# Patient Record
Sex: Female | Born: 1965 | Race: White | Hispanic: No | Smoking: Never smoker
Health system: Southern US, Community
[De-identification: ages and names within clinical notes are randomized; demographics above are authoritative.]

## PROBLEM LIST (undated history)

## (undated) DIAGNOSIS — I10 Essential (primary) hypertension: Secondary | ICD-10-CM

## (undated) DIAGNOSIS — N159 Renal tubulo-interstitial disease, unspecified: Secondary | ICD-10-CM

## (undated) DIAGNOSIS — S62109A Fracture of unspecified carpal bone, unspecified wrist, initial encounter for closed fracture: Secondary | ICD-10-CM

## (undated) HISTORY — PX: ABLATION: SHX5711

## (undated) HISTORY — PX: GALLBLADDER SURGERY: SHX652

## (undated) HISTORY — PX: TUBAL LIGATION: SHX77

## (undated) HISTORY — DX: Essential (primary) hypertension: I10

## (undated) HISTORY — DX: Renal tubulo-interstitial disease, unspecified: N15.9

## (undated) HISTORY — DX: Fracture of unspecified carpal bone, unspecified wrist, initial encounter for closed fracture: S62.109A

---

## 2006-05-19 ENCOUNTER — Ambulatory Visit (HOSPITAL_COMMUNITY): Admission: RE | Admit: 2006-05-19 | Discharge: 2006-05-19 | Payer: Self-pay | Admitting: Obstetrics & Gynecology

## 2006-05-19 ENCOUNTER — Encounter (INDEPENDENT_AMBULATORY_CARE_PROVIDER_SITE_OTHER): Payer: Self-pay | Admitting: *Deleted

## 2008-02-17 ENCOUNTER — Ambulatory Visit (HOSPITAL_COMMUNITY): Admission: RE | Admit: 2008-02-17 | Discharge: 2008-02-17 | Payer: Self-pay | Admitting: *Deleted

## 2008-07-13 ENCOUNTER — Other Ambulatory Visit: Admission: RE | Admit: 2008-07-13 | Discharge: 2008-07-13 | Payer: Self-pay | Admitting: Obstetrics and Gynecology

## 2010-12-11 ENCOUNTER — Emergency Department (HOSPITAL_COMMUNITY)
Admission: EM | Admit: 2010-12-11 | Discharge: 2010-12-11 | Payer: Self-pay | Source: Home / Self Care | Admitting: Emergency Medicine

## 2011-01-11 ENCOUNTER — Encounter: Payer: Self-pay | Admitting: *Deleted

## 2011-02-24 ENCOUNTER — Other Ambulatory Visit: Payer: Self-pay | Admitting: Obstetrics & Gynecology

## 2011-02-24 DIAGNOSIS — N644 Mastodynia: Secondary | ICD-10-CM

## 2011-02-24 DIAGNOSIS — N63 Unspecified lump in unspecified breast: Secondary | ICD-10-CM

## 2011-03-04 ENCOUNTER — Ambulatory Visit (HOSPITAL_COMMUNITY)
Admission: RE | Admit: 2011-03-04 | Discharge: 2011-03-04 | Disposition: A | Discharge status: Home / Self Care | Payer: BC Managed Care – PPO | Source: Ambulatory Visit | Attending: Obstetrics & Gynecology | Admitting: Obstetrics & Gynecology

## 2011-03-04 DIAGNOSIS — N63 Unspecified lump in unspecified breast: Secondary | ICD-10-CM | POA: Insufficient documentation

## 2011-03-04 DIAGNOSIS — N6009 Solitary cyst of unspecified breast: Secondary | ICD-10-CM | POA: Insufficient documentation

## 2011-03-04 DIAGNOSIS — N644 Mastodynia: Secondary | ICD-10-CM

## 2011-03-23 ENCOUNTER — Other Ambulatory Visit (HOSPITAL_COMMUNITY): Payer: Self-pay | Admitting: Family Medicine

## 2011-03-23 DIAGNOSIS — N6489 Other specified disorders of breast: Secondary | ICD-10-CM

## 2011-04-01 ENCOUNTER — Ambulatory Visit (HOSPITAL_COMMUNITY)
Admission: RE | Admit: 2011-04-01 | Discharge: 2011-04-01 | Disposition: A | Discharge status: Home / Self Care | Payer: BC Managed Care – PPO | Source: Ambulatory Visit | Attending: Family Medicine | Admitting: Family Medicine

## 2011-04-01 ENCOUNTER — Ambulatory Visit (HOSPITAL_COMMUNITY): Payer: BC Managed Care – PPO

## 2011-04-01 DIAGNOSIS — N6489 Other specified disorders of breast: Secondary | ICD-10-CM

## 2011-04-03 ENCOUNTER — Other Ambulatory Visit (HOSPITAL_COMMUNITY)
Admission: RE | Admit: 2011-04-03 | Discharge: 2011-04-03 | Disposition: A | Discharge status: Home / Self Care | Payer: BC Managed Care – PPO | Source: Ambulatory Visit | Attending: Obstetrics & Gynecology | Admitting: Obstetrics & Gynecology

## 2011-04-03 ENCOUNTER — Other Ambulatory Visit: Payer: Self-pay | Admitting: Obstetrics & Gynecology

## 2011-04-03 DIAGNOSIS — Z01419 Encounter for gynecological examination (general) (routine) without abnormal findings: Secondary | ICD-10-CM | POA: Insufficient documentation

## 2011-05-08 NOTE — Op Note (Signed)
Brandy Washington, Brandy Washington                 ACCOUNT NO.:  [id number]   MEDICAL RECORD NO.:  [id number]          PATIENT TYPE:  AMB   LOCATION:  DAY                           FACILITY:  [facility]   PHYSICIAN:  Lazaro Arms, M.D.   DATE OF BIRTH:  1966-08-17   DATE OF PROCEDURE:  05/19/2006  DATE OF DISCHARGE:                                 OPERATIVE REPORT   PREOPERATIVE DIAGNOSES:  1.  Menometrorrhagia.  2.  Dysmenorrhea.   POSTOPERATIVE DIAGNOSES:  1.  Menometrorrhagia.  2.  Dysmenorrhea.   OPERATION PERFORMED:  Hysteroscopy, dilation and curettage and endometrial  ablation.   SURGEON:  Lazaro Arms, M.D.   ANESTHESIA:  General endotracheal.   FINDINGS:  The patient had a normal endometrial cavity.  There was an  appropriate amount of endometrial tissue, no polyps, no fibroids, no  abnormalities.   DESCRIPTION OF PROCEDURE:  The patient was taken to the operating room,  placed in supine position where she underwent general endotracheal  anesthesia, placed in dorsal lithotomy position, prepped and draped in the  usual sterile fashion.  A Graves speculum was placed.  The cervix was  grasped, the cervix was dilated serially to allow passage of a hysteroscope.  Hysteroscopy was performed and the above noted findings were seen.  Vigorous  uterine curettage was performed and good uterine cry was obtained in all  areas.  Endometrial ablation was then performed using the ThermaChoice 3  balloon that required 16 mL of D5W to maintain a pressure between 190 and  200 mmHg throughout the procedure.  Total therapy time was five minutes and  11 seconds.  The temperature of ablation was 80 degrees Celsius.  The  patient was awakened from anesthesia after all the fluid was recovered from  the balloon, taken to recovery room in good stable condition, all counts  correct.      Lazaro Arms, M.D.  Electronically Signed     LHE/MEDQ  D:  05/19/2006  T:  05/19/2006  Job:  [id number]

## 2011-09-11 LAB — CSF CULTURE W GRAM STAIN
Culture: NO GROWTH
Gram Stain: NONE SEEN

## 2011-09-11 LAB — CSF CELL COUNT WITH DIFFERENTIAL
Eosinophils, CSF: 0
Eosinophils, CSF: 0
Monocyte-Macrophage-Spinal Fluid: 0 — ABNORMAL LOW
RBC Count, CSF: 19 — ABNORMAL HIGH
RBC Count, CSF: 2 — ABNORMAL HIGH
Segmented Neutrophils-CSF: 0
Tube #: 1
Tube #: 4
WBC, CSF: 2
WBC, CSF: 2

## 2011-09-11 LAB — B. BURGDORFI ANTIBODIES, CSF: Lyme Ab: 0.08 LIV

## 2011-09-11 LAB — HSV PCR
HSV 2 , PCR: NOT DETECTED
HSV, PCR: NOT DETECTED

## 2011-09-11 LAB — CYTOMEGALOVIRUS PCR, QUALITATIVE: Cytomegalovirus DNA: NOT DETECTED

## 2011-09-11 LAB — PROTEIN AND GLUCOSE, CSF
Glucose, CSF: 51
Glucose, CSF: 54
Total  Protein, CSF: 32
Total  Protein, CSF: 36

## 2011-09-11 LAB — OLIGOCLONAL BANDS, CSF + SERM
Albumin Index: 3.6 ratio (ref 0.0–9.0)
Albumin, CSF: 19 mg/dL (ref 0–35)
Albumin, Serum(Neph): 5240 mg/dL — ABNORMAL HIGH (ref 3500–5200)
CSF Oligoclonal Bands: NEGATIVE
IgG Index, CSF: 0.6 ratio (ref 0.28–0.66)
IgG, CSF: 2.7 mg/dL (ref 0.0–6.0)
IgG, Serum: 1240 mg/dL (ref 768–1632)
IgG/Albumin Ratio, CSF: 0.14 ratio (ref 0.09–0.25)
MS CNS IgG Synthesis Rate: 0 mg/d (ref 0.0–8.0)

## 2011-09-11 LAB — VDRL, CSF: VDRL Quant, CSF: NONREACTIVE

## 2012-03-10 ENCOUNTER — Other Ambulatory Visit: Payer: Self-pay | Admitting: Obstetrics & Gynecology

## 2012-03-10 DIAGNOSIS — Z139 Encounter for screening, unspecified: Secondary | ICD-10-CM

## 2012-03-17 ENCOUNTER — Ambulatory Visit (HOSPITAL_COMMUNITY)
Admission: RE | Admit: 2012-03-17 | Discharge: 2012-03-17 | Disposition: A | Discharge status: Home / Self Care | Payer: BC Managed Care – PPO | Source: Ambulatory Visit | Attending: Obstetrics & Gynecology | Admitting: Obstetrics & Gynecology

## 2012-03-17 DIAGNOSIS — Z139 Encounter for screening, unspecified: Secondary | ICD-10-CM

## 2012-03-17 DIAGNOSIS — Z1231 Encounter for screening mammogram for malignant neoplasm of breast: Secondary | ICD-10-CM | POA: Insufficient documentation

## 2012-04-07 ENCOUNTER — Other Ambulatory Visit (HOSPITAL_COMMUNITY)
Admission: RE | Admit: 2012-04-07 | Discharge: 2012-04-07 | Disposition: A | Discharge status: Home / Self Care | Payer: BC Managed Care – PPO | Source: Ambulatory Visit | Attending: Obstetrics & Gynecology | Admitting: Obstetrics & Gynecology

## 2012-04-07 ENCOUNTER — Other Ambulatory Visit: Payer: Self-pay | Admitting: Obstetrics & Gynecology

## 2012-04-07 DIAGNOSIS — Z01419 Encounter for gynecological examination (general) (routine) without abnormal findings: Secondary | ICD-10-CM | POA: Insufficient documentation
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
## 2013-05-05 ENCOUNTER — Other Ambulatory Visit: Payer: Self-pay | Admitting: Obstetrics & Gynecology

## 2013-05-05 DIAGNOSIS — Z139 Encounter for screening, unspecified: Secondary | ICD-10-CM

## 2013-05-08 ENCOUNTER — Encounter: Payer: Self-pay | Admitting: *Deleted

## 2013-05-09 ENCOUNTER — Encounter: Payer: Self-pay | Admitting: Obstetrics & Gynecology

## 2013-05-09 ENCOUNTER — Ambulatory Visit (HOSPITAL_COMMUNITY)
Admission: RE | Admit: 2013-05-09 | Discharge: 2013-05-09 | Disposition: A | Discharge status: Home / Self Care | Payer: BC Managed Care – PPO | Source: Ambulatory Visit | Attending: Obstetrics & Gynecology | Admitting: Obstetrics & Gynecology

## 2013-05-09 ENCOUNTER — Ambulatory Visit (INDEPENDENT_AMBULATORY_CARE_PROVIDER_SITE_OTHER): Payer: BC Managed Care – PPO | Admitting: Obstetrics & Gynecology

## 2013-05-09 VITALS — BP 150/80 | Ht 65.0 in | Wt 257.0 lb

## 2013-05-09 DIAGNOSIS — Z139 Encounter for screening, unspecified: Secondary | ICD-10-CM

## 2013-05-09 DIAGNOSIS — Z01419 Encounter for gynecological examination (general) (routine) without abnormal findings: Secondary | ICD-10-CM

## 2013-05-09 DIAGNOSIS — Z1231 Encounter for screening mammogram for malignant neoplasm of breast: Secondary | ICD-10-CM | POA: Insufficient documentation

## 2013-05-09 NOTE — Progress Notes (Signed)
Patient ID: Brandy Washington, female   DOB: 1966/04/01, 47 y.o.   MRN: [id number] Subjective:     Brandy Washington is a 47 y.o. female here for a routine exam.  No LMP recorded. Patient is postmenopausal. G3P3 Current complaints: none.  Personal health questionnaire reviewed: yes.   Gynecologic History No LMP recorded. Patient is postmenopausal. Contraception: post menopausal status Last Pap: 2013. Results were: normal Last mammogram: today. Results were: pending  Obstetric History OB History   Grav Para Term Preterm Abortions TAB SAB Ect Mult Living   3 3        2      # Outc Date GA Lbr Len/2nd Wgt Sex Del Anes PTL Lv   1 PAR 1988   8lb3oz(3.714kg) F SVD      2 PAR 1989    F SVD   Yes   3 PAR 1990   8lb9oz(3.884kg) M SVD   Yes       The following portions of the patient's history were reviewed and updated as appropriate: allergies, current medications, past family history, past medical history, past social history, past surgical history and problem list.  Review of Systems  Review of Systems  Constitutional: Negative for fever, chills, weight loss, malaise/fatigue and diaphoresis.  HENT: Negative for hearing loss, ear pain, nosebleeds, congestion, sore throat, neck pain, tinnitus and ear discharge.   Eyes: Negative for blurred vision, double vision, photophobia, pain, discharge and redness.  Respiratory: Negative for cough, hemoptysis, sputum production, shortness of breath, wheezing and stridor.   Cardiovascular: Negative for chest pain, palpitations, orthopnea, claudication, leg swelling and PND.  Gastrointestinal: negative for abdominal pain. Negative for heartburn, nausea, vomiting, diarrhea, constipation, blood in stool and melena.  Genitourinary: Negative for dysuria, urgency, frequency, hematuria and flank pain.  Musculoskeletal: Negative for myalgias, back pain, joint pain and falls.  Skin: Negative for itching and rash.  Neurological: Negative for dizziness, tingling,  tremors, sensory change, speech change, focal weakness, seizures, loss of consciousness, weakness and headaches.  Endo/Heme/Allergies: Negative for environmental allergies and polydipsia. Does not bruise/bleed easily.  Psychiatric/Behavioral: Negative for depression, suicidal ideas, hallucinations, memory loss and substance abuse. The patient is not nervous/anxious and does not have insomnia.        Objective:    Physical Exam  Vitals reviewed. Constitutional: She is oriented to person, place, and time. She appears well-developed and well-nourished.  HENT:  Head: Normocephalic and atraumatic.        Right Ear: External ear normal.  Left Ear: External ear normal.  Nose: Nose normal.  Mouth/Throat: Oropharynx is clear and moist.  Eyes: Conjunctivae and EOM are normal. Pupils are equal, round, and reactive to light. Right eye exhibits no discharge. Left eye exhibits no discharge. No scleral icterus.  Neck: Normal range of motion. Neck supple. No tracheal deviation present. No thyromegaly present.  Cardiovascular: Normal rate, regular rhythm, normal heart sounds and intact distal pulses.  Exam reveals no gallop and no friction rub.   No murmur heard. Respiratory: Effort normal and breath sounds normal. No respiratory distress. She has no wheezes. She has no rales. She exhibits no tenderness.  GI: Soft. Bowel sounds are normal. She exhibits no distension and no mass. There is no tenderness. There is no rebound and no guarding.  Genitourinary:       Vulva is normal without lesions Vagina is pink moist without discharge Cervix normal in appearance and pap is done Uterus is normal size shape and contour Adnexa is negative  with normal sized ovaries   Musculoskeletal: Normal range of motion. She exhibits no edema and no tenderness.  Neurological: She is alert and oriented to person, place, and time. She has normal reflexes. She displays normal reflexes. No cranial nerve deficit. She exhibits  normal muscle tone. Coordination normal.  Skin: Skin is warm and dry. No rash noted. No erythema. No pallor.  Psychiatric: She has a normal mood and affect. Her behavior is normal. Judgment and thought content normal.       Assessment:    Healthy female exam.    Plan:    Follow up in: 1 year.

## 2014-05-10 ENCOUNTER — Other Ambulatory Visit (HOSPITAL_COMMUNITY)
Admission: RE | Admit: 2014-05-10 | Discharge: 2014-05-10 | Disposition: A | Discharge status: Home / Self Care | Payer: BC Managed Care – PPO | Source: Ambulatory Visit | Attending: Obstetrics & Gynecology | Admitting: Obstetrics & Gynecology

## 2014-05-10 ENCOUNTER — Ambulatory Visit (INDEPENDENT_AMBULATORY_CARE_PROVIDER_SITE_OTHER): Payer: BC Managed Care – PPO | Admitting: Obstetrics & Gynecology

## 2014-05-10 ENCOUNTER — Encounter: Payer: Self-pay | Admitting: Obstetrics & Gynecology

## 2014-05-10 VITALS — BP 100/80 | Ht 66.0 in | Wt 257.0 lb

## 2014-05-10 DIAGNOSIS — Z01419 Encounter for gynecological examination (general) (routine) without abnormal findings: Secondary | ICD-10-CM

## 2014-05-10 DIAGNOSIS — I10 Essential (primary) hypertension: Secondary | ICD-10-CM | POA: Insufficient documentation

## 2014-05-10 DIAGNOSIS — Z1151 Encounter for screening for human papillomavirus (HPV): Secondary | ICD-10-CM | POA: Insufficient documentation

## 2014-05-10 NOTE — Progress Notes (Signed)
Patient ID: Brandy Washington, female   DOB: May 16, 1966, 48 y.o.   MRN: [id number] Subjective:     Brandy Washington is a 48 y.o. female here for a routine exam.  No LMP recorded. Patient has had an ablation. G3P3 Birth Control Method:  *BTL** Menstrual Calendar(currently): amenorrheic  Current complaints: none.   Current acute medical issues:  hypertension   Recent Gynecologic History No LMP recorded. Patient has had an ablation. Last Pap: 2014,  normal Last mammogram: 2014,  normal  Past Medical History  Diagnosis Date  . Broken wrist     right  . Kidney infection   . Hypertension     Past Surgical History  Procedure Laterality Date  . Tubal ligation      OB History   Grav Para Term Preterm Abortions TAB SAB Ect Mult Living   3 3        2       History   Social History  . Marital Status: Married    Spouse Name: N/A    Number of Children: N/A  . Years of Education: N/A   Social History Main Topics  . Smoking status: Never Smoker   . Smokeless tobacco: Never Used  . Alcohol Use: No  . Drug Use: No  . Sexual Activity: Yes   Other Topics Concern  . None   Social History Narrative  . None    Family History  Problem Relation Age of Onset  . Stroke Other   . Hypertension Other   . Heart disease Other   . Cancer Other      Review of Systems  Review of Systems  Constitutional: Negative for fever, chills, weight loss, malaise/fatigue and diaphoresis.  HENT: Negative for hearing loss, ear pain, nosebleeds, congestion, sore throat, neck pain, tinnitus and ear discharge.   Eyes: Negative for blurred vision, double vision, photophobia, pain, discharge and redness.  Respiratory: Negative for cough, hemoptysis, sputum production, shortness of breath, wheezing and stridor.   Cardiovascular: Negative for chest pain, palpitations, orthopnea, claudication, leg swelling and PND.  Gastrointestinal: negative for abdominal pain. Negative for heartburn, nausea, vomiting,  diarrhea, constipation, blood in stool and melena.  Genitourinary: Negative for dysuria, urgency, frequency, hematuria and flank pain.  Musculoskeletal: Negative for myalgias, back pain, joint pain and falls.  Skin: Negative for itching and rash.  Neurological: Negative for dizziness, tingling, tremors, sensory change, speech change, focal weakness, seizures, loss of consciousness, weakness and headaches.  Endo/Heme/Allergies: Negative for environmental allergies and polydipsia. Does not bruise/bleed easily.  Psychiatric/Behavioral: Negative for depression, suicidal ideas, hallucinations, memory loss and substance abuse. The patient is not nervous/anxious and does not have insomnia.        Objective:    Physical Exam  Vitals reviewed. Constitutional: She is oriented to person, place, and time. She appears well-developed and well-nourished.  HENT:  Head: Normocephalic and atraumatic.        Right Ear: External ear normal.  Left Ear: External ear normal.  Nose: Nose normal.  Mouth/Throat: Oropharynx is clear and moist.  Eyes: Conjunctivae and EOM are normal. Pupils are equal, round, and reactive to light. Right eye exhibits no discharge. Left eye exhibits no discharge. No scleral icterus.  Neck: Normal range of motion. Neck supple. No tracheal deviation present. No thyromegaly present.  Cardiovascular: Normal rate, regular rhythm, normal heart sounds and intact distal pulses.  Exam reveals no gallop and no friction rub.   No murmur heard. Respiratory: Effort normal and breath sounds  normal. No respiratory distress. She has no wheezes. She has no rales. She exhibits no tenderness.  GI: Soft. Bowel sounds are normal. She exhibits no distension and no mass. There is no tenderness. There is no rebound and no guarding.  Genitourinary:  Breasts no masses skin changes or nipple changes bilaterally      Vulva is normal without lesions Vagina is pink moist without discharge Cervix normal in  appearance and pap is done Uterus is normal size shape and contour Adnexa is negative with normal sized ovaries    Musculoskeletal: Normal range of motion. She exhibits no edema and no tenderness.  Neurological: She is alert and oriented to person, place, and time. She has normal reflexes. She displays normal reflexes. No cranial nerve deficit. She exhibits normal muscle tone. Coordination normal.  Skin: Skin is warm and dry. No rash noted. No erythema. No pallor.  Psychiatric: She has a normal mood and affect. Her behavior is normal. Judgment and thought content normal.       Assessment:    Healthy female exam.    Plan:    Mammogram ordered. Follow up in: 1 year.

## 2014-05-23 ENCOUNTER — Other Ambulatory Visit: Payer: Self-pay | Admitting: Obstetrics & Gynecology

## 2014-05-23 DIAGNOSIS — Z139 Encounter for screening, unspecified: Secondary | ICD-10-CM

## 2014-05-28 ENCOUNTER — Ambulatory Visit (HOSPITAL_COMMUNITY)
Admission: RE | Admit: 2014-05-28 | Discharge: 2014-05-28 | Disposition: A | Discharge status: Home / Self Care | Payer: BC Managed Care – PPO | Source: Ambulatory Visit | Attending: Obstetrics & Gynecology | Admitting: Obstetrics & Gynecology

## 2014-05-28 DIAGNOSIS — Z139 Encounter for screening, unspecified: Secondary | ICD-10-CM

## 2014-05-28 DIAGNOSIS — Z1231 Encounter for screening mammogram for malignant neoplasm of breast: Secondary | ICD-10-CM | POA: Insufficient documentation

## 2014-06-12 ENCOUNTER — Telehealth: Payer: Self-pay | Admitting: *Deleted

## 2014-06-12 NOTE — Telephone Encounter (Signed)
Pt informed pap from 05/10/2014 WNL HPV negative.

## 2014-06-13 ENCOUNTER — Encounter: Payer: Self-pay | Admitting: Obstetrics & Gynecology

## 2014-06-13 ENCOUNTER — Other Ambulatory Visit: Payer: Self-pay | Admitting: Obstetrics & Gynecology

## 2014-06-13 ENCOUNTER — Ambulatory Visit (INDEPENDENT_AMBULATORY_CARE_PROVIDER_SITE_OTHER): Payer: BC Managed Care – PPO

## 2014-06-13 ENCOUNTER — Ambulatory Visit (INDEPENDENT_AMBULATORY_CARE_PROVIDER_SITE_OTHER): Payer: BC Managed Care – PPO | Admitting: Obstetrics & Gynecology

## 2014-06-13 VITALS — BP 120/80 | Ht 66.0 in | Wt 248.0 lb

## 2014-06-13 DIAGNOSIS — K819 Cholecystitis, unspecified: Secondary | ICD-10-CM

## 2014-06-13 DIAGNOSIS — R110 Nausea: Secondary | ICD-10-CM

## 2014-06-13 DIAGNOSIS — R1033 Periumbilical pain: Secondary | ICD-10-CM

## 2014-06-13 DIAGNOSIS — Z3202 Encounter for pregnancy test, result negative: Secondary | ICD-10-CM

## 2014-06-13 LAB — POCT URINE PREGNANCY: Preg Test, Ur: NEGATIVE

## 2014-06-13 NOTE — Progress Notes (Signed)
Patient ID: Brandy Washington Monica, female   DOB: [birth year], 48 y.o.   MRN: [id number] Pt with increasing post prandiol symptoms with right mid abdominal pain, not pelvic  Exam Tender RUS no rebound We did a sonogram:  to evluate the gllbladder  Brandy Washington [name] is a 48 y.o. G3P3 for a pelvic sonogram for Abdominal pain with nausea x months  Rt kidney appears WNL no hydronephrosis noted  GB- appears with multiple stones noted within largest - 1.1cm Wall-2.302mm  CBD- 1.635mm.  Uterus Anteverted appears WNL  Endometrium S/p ablation  Right ovary Adnexa appears WNL  Left ovary 3.1 x 2.3 x 2.0 cm,  No free fluid or adnexal masses noted within the pelvis  Technician Comments:  Gallstones noted largest= 1.1cm, GB wall-2.162mm, CBD-1.485mm, Rt kidney appears WNL, Uterus anteverted, Rt adnexa appears WNL, Lt ovary appears WNL, no free fluid or adnexal masses noted within the pelvis  Chari ManningMcBride, Tasha  06/13/2014  3:12 PM  Cholelithiasis, symptomatic  Referral to dr Lovell Sheehanjenkins made for proba ble cholecystectomy

## 2014-06-21 ENCOUNTER — Encounter (HOSPITAL_COMMUNITY): Payer: Self-pay

## 2014-06-21 ENCOUNTER — Encounter (HOSPITAL_COMMUNITY)
Admission: RE | Admit: 2014-06-21 | Discharge: 2014-06-21 | Disposition: A | Discharge status: Home / Self Care | Payer: BC Managed Care – PPO | Source: Ambulatory Visit | Attending: General Surgery | Admitting: General Surgery

## 2014-06-21 ENCOUNTER — Other Ambulatory Visit: Payer: Self-pay

## 2014-06-21 DIAGNOSIS — Z01810 Encounter for preprocedural cardiovascular examination: Secondary | ICD-10-CM | POA: Insufficient documentation

## 2014-06-21 DIAGNOSIS — Z01812 Encounter for preprocedural laboratory examination: Secondary | ICD-10-CM | POA: Insufficient documentation

## 2014-06-21 LAB — CBC WITH DIFFERENTIAL/PLATELET
Basophils Absolute: 0 10*3/uL (ref 0.0–0.1)
Basophils Relative: 1 % (ref 0–1)
Eosinophils Absolute: 0.1 10*3/uL (ref 0.0–0.7)
Eosinophils Relative: 2 % (ref 0–5)
HCT: 38.1 % (ref 36.0–46.0)
Hemoglobin: 12.7 g/dL (ref 12.0–15.0)
Lymphocytes Relative: 34 % (ref 12–46)
Lymphs Abs: 2.8 10*3/uL (ref 0.7–4.0)
MCH: 26.8 pg (ref 26.0–34.0)
MCHC: 33.3 g/dL (ref 30.0–36.0)
MCV: 80.5 fL (ref 78.0–100.0)
Monocytes Absolute: 0.9 10*3/uL (ref 0.1–1.0)
Monocytes Relative: 11 % (ref 3–12)
Neutro Abs: 4.2 10*3/uL (ref 1.7–7.7)
Neutrophils Relative %: 52 % (ref 43–77)
Platelets: 257 10*3/uL (ref 150–400)
RBC: 4.73 MIL/uL (ref 3.87–5.11)
RDW: 13.4 % (ref 11.5–15.5)
WBC: 8 10*3/uL (ref 4.0–10.5)

## 2014-06-21 LAB — HEPATIC FUNCTION PANEL
ALT: 12 U/L (ref 0–35)
AST: 13 U/L (ref 0–37)
Albumin: 4 g/dL (ref 3.5–5.2)
Alkaline Phosphatase: 61 U/L (ref 39–117)
Bilirubin, Direct: <0.2 mg/dL (ref 0.0–0.3)
Total Bilirubin: 0.2 mg/dL — ABNORMAL LOW (ref 0.3–1.2)
Total Protein: 7.3 g/dL (ref 6.0–8.3)

## 2014-06-21 LAB — BASIC METABOLIC PANEL
Anion gap: 12 (ref 5–15)
BUN: 15 mg/dL (ref 6–23)
CO2: 27 mEq/L (ref 19–32)
Calcium: 10.2 mg/dL (ref 8.4–10.5)
Chloride: 101 mEq/L (ref 96–112)
Creatinine, Ser: 1.01 mg/dL (ref 0.50–1.10)
GFR calc Af Amer: 76 mL/min — ABNORMAL LOW (ref >90)
GFR calc non Af Amer: 65 mL/min — ABNORMAL LOW (ref >90)
Glucose, Bld: 96 mg/dL (ref 70–99)
Potassium: 4.1 mEq/L (ref 3.7–5.3)
Sodium: 140 mEq/L (ref 137–147)

## 2014-06-21 NOTE — Patient Instructions (Signed)
Brandy SheffieldLisa V Washington  06/21/2014   Your procedure is scheduled on:   06/25/2014  [facility]  [facility].  Call this number if you have problems the morning of surgery: [phone]   Remember:   Do not eat food or drink liquids after midnight.   Take these medicines the morning of surgery with A SIP OF WATER: metoprolol, lisinopril   Do not wear jewelry, make-up or nail polish.  Do not wear lotions, powders, or perfumes.   Do not shave 48 hours prior to surgery. Men may shave face and neck.  Do not bring valuables to the hospital.  [facility] is not responsible for any belongings or valuables.               Contacts, dentures or bridgework may not be worn into surgery.  Leave suitcase in the car. After surgery it may be brought to your room.  For patients admitted to the hospital, discharge time is determined by your treatment team.               Patients discharged the day of surgery will not be allowed to drive home.  Name and phone number of your driver: family  Special Instructions: Shower using CHG 2 nights before surgery and the night before surgery.  If you shower the day of surgery use CHG.  Use special wash - you have one bottle of CHG for all showers.  You should use approximately 1/3 of the bottle for each shower.   Please read over the following fact sheets that you were given: Pain Booklet, Coughing and Deep Breathing, Surgical Site Infection Prevention, Anesthesia Post-op Instructions and Care and Recovery After Surgery Laparoscopic Cholecystectomy Laparoscopic cholecystectomy is surgery to remove the gallbladder. The gallbladder is located in the upper right part of the abdomen, behind the liver. It is a storage sac for bile produced in the liver. Bile aids in the digestion and absorption of fats. Cholecystectomy is often done for inflammation of the gallbladder (cholecystitis). This condition is usually caused by a buildup of gallstones (cholelithiasis) in your  gallbladder. Gallstones can block the flow of bile, resulting in inflammation and pain. In severe cases, emergency surgery may be required. When emergency surgery is not required, you will have time to prepare for the procedure. Laparoscopic surgery is an alternative to open surgery. Laparoscopic surgery has a shorter recovery time. Your common bile duct may also need to be examined during the procedure. If stones are found in the common bile duct, they may be removed. LET [facility] KNOW ABOUT:  Any allergies you have.  All medicines you are taking, including vitamins, herbs, eye drops, creams, and over-the-counter medicines.  Previous problems you or members of your family have had with the use of anesthetics.  Any blood disorders you have.  Previous surgeries you have had.  Medical conditions you have. RISKS AND COMPLICATIONS Generally, this is a safe procedure. However, as with any procedure, complications can occur. Possible complications include:  Infection.  Damage to the common bile duct, nerves, arteries, veins, or other internal organs such as the stomach, liver, or intestines.  Bleeding.  A stone may remain in the common bile duct.  A bile leak from the cyst duct that is clipped when your gallbladder is removed.  The need to convert to open surgery, which requires a larger incision in the abdomen. This may be necessary if your surgeon thinks it  is not safe to continue with a laparoscopic procedure. BEFORE THE PROCEDURE  Ask your health care provider about changing or stopping any regular medicines. You will need to stop taking aspirin or blood thinners at least 5 days prior to surgery.  Do not eat or drink anything after midnight the night before surgery.  Let your health care provider know if you develop a cold or other infectious problem before surgery. PROCEDURE   You will be given medicine to make you sleep through the procedure (general  anesthetic). A breathing tube will be placed in your mouth.  When you are asleep, your surgeon will make several small cuts (incisions) in your abdomen.  A thin, lighted tube with a tiny camera on the end (laparoscope) is inserted through one of the small incisions. The camera on the laparoscope sends a picture to a TV screen in the operating room. This gives the surgeon a good view inside your abdomen.  A gas will be pumped into your abdomen. This expands your abdomen so that the surgeon has more room to perform the surgery.  Other tools needed for the procedure are inserted through the other incisions. The gallbladder is removed through one of the incisions.  After the removal of your gallbladder, the incisions will be closed with stitches, staples, or skin glue. AFTER THE PROCEDURE  You will be taken to a recovery area where your progress will be checked often.  You may be allowed to go home the same day if your pain is controlled and you can tolerate liquids. Document Released: 12/07/2005 Document Revised: 09/27/2013 Document Reviewed: 07/19/2013 [facility] Patient Information 2015 [place], [place]. This information is not intended to replace advice given to you by your health care provider. Make sure you discuss any questions you have with your health care provider. PATIENT INSTRUCTIONS POST-ANESTHESIA  IMMEDIATELY FOLLOWING SURGERY:  Do not drive or operate machinery for the first twenty four hours after surgery.  Do not make any important decisions for twenty four hours after surgery or while taking narcotic pain medications or sedatives.  If you develop intractable nausea and vomiting or a severe headache please notify your doctor immediately.  FOLLOW-UP:  Please make an appointment with your surgeon as instructed. You do not need to follow up with anesthesia unless specifically instructed to do so.  WOUND CARE INSTRUCTIONS (if applicable):  Keep a dry clean dressing on the  anesthesia/puncture wound site if there is drainage.  Once the wound has quit draining you may leave it open to air.  Generally you should leave the bandage intact for twenty four hours unless there is drainage.  If the epidural site drains for more than 36-48 hours please call the anesthesia department.  QUESTIONS?:  Please feel free to call your physician or the hospital operator if you have any questions, and they will be happy to assist you.

## 2014-06-21 NOTE — Pre-Procedure Instructions (Signed)
Patient given information to sign up for my chart at home. 

## 2014-06-23 NOTE — H&P (Signed)
  NTS SOAP Note  Vital Signs:  Vitals as of: 06/21/2014: Systolic 137: Diastolic 111: Heart Rate 76: Temp 97.77F: Height 325ft 6in: Weight 250Lbs 0 Ounces: BMI 40.35  BMI : 40.35 kg/m2  Subjective: This [age] Years 610 Months old Female presents for of biliary colic secondary to cholelithiasis.  Has been having intermittent right upper quadrant abdominal pain radiating to the back, nausea, and bloating for some time now.  U/S shows cholelithiasis, normal common bile duct.  No fever, chills, jaundice.  Review of Symptoms:  Constitutional:unremarkable   Head:unremarkable    Eyes:unremarkable   Nose/Mouth/Throat:unremarkable Cardiovascular:  unremarkable   Respiratory:unremarkable   Gastrointestin    abdominal pain,nausea,heartburn Genitourinary:unremarkable     Musculoskeletal:unremarkable   Skin:unremarkable Hematolgic/Lymphatic:unremarkable     Allergic/Immunologic:unremarkable     Past Medical History:    Reviewed  Past Medical History  Surgical History: uterine ablation Medical Problems: HTN Allergies: nkda Medications: lisinopril, metoprolol   Social History:Reviewed  Social History  Preferred Language: English Race:  White Ethnicity: Not Hispanic / Latino Age: [age] Years 0 Months Marital Status:  M Alcohol: no   Smoking Status: Never smoker reviewed on 06/21/2014 Functional Status reviewed on 06/21/2014 ------------------------------------------------ Bathing: Normal Cooking: Normal Dressing: Normal Driving: Normal Eating: Normal Managing Meds: Normal Oral Care: Normal Shopping: Normal Toileting: Normal Transferring: Normal Walking: Normal Cognitive Status reviewed on 06/21/2014 ------------------------------------------------ Attention: Normal Decision Making: Normal Language: Normal Memory: Normal Motor: Normal Perception: Normal Problem Solving: Normal Visual and Spatial: Normal   Family History:   Reviewed  Family Health History Mother, Living; Healthy;  Father, Deceased; Heart attack (myocardial infarction);     Objective Information: General:  Well appearing, well nourished in no distress.   no scleral icterus Heart:  RRR, no murmur or gallop.  Normal S1, S2.  No S3, S4.  Lungs:    CTA bilaterally, no wheezes, rhonchi, rales.  Breathing unlabored. Abdomen:Soft, slightly tender in right upper quadrant to palpation, ND, normal bowel sounds, no HSM, no masses.  No peritoneal signs.   Assessment:cholelithaisis, biliary colic  Diagnoses: 574.20 Gallstone (Calculus of gallbladder without cholecystitis without obstruction)  Procedures: [id number] - OFFICE OUTPATIENT NEW 30 MINUTES    Plan:  Scheduled for laparoscopic cholecystectomy on 06/25/14.   Patient Education:Alternative treatments to surgery were discussed with patient (and family).  Risks and benefits  of procedure including bleeding, infection, hepatobiliary injury, and the possibility of an open procedure were fully explained to the patient (and family) who gave informed consent. Patient/family questions were addressed.  Follow-up:Pending Surgery

## 2014-06-25 ENCOUNTER — Ambulatory Visit (HOSPITAL_COMMUNITY): Payer: BC Managed Care – PPO | Admitting: Anesthesiology

## 2014-06-25 ENCOUNTER — Encounter (HOSPITAL_COMMUNITY)
Admission: RE | Disposition: A | Discharge status: Home / Self Care | Payer: Self-pay | Source: Ambulatory Visit | Attending: General Surgery

## 2014-06-25 ENCOUNTER — Encounter (HOSPITAL_COMMUNITY): Payer: BC Managed Care – PPO | Admitting: Anesthesiology

## 2014-06-25 ENCOUNTER — Ambulatory Visit (HOSPITAL_COMMUNITY)
Admission: RE | Admit: 2014-06-25 | Discharge: 2014-06-25 | Disposition: A | Discharge status: Home / Self Care | Payer: BC Managed Care – PPO | Source: Ambulatory Visit | Attending: General Surgery | Admitting: General Surgery

## 2014-06-25 DIAGNOSIS — Z79899 Other long term (current) drug therapy: Secondary | ICD-10-CM | POA: Insufficient documentation

## 2014-06-25 DIAGNOSIS — K8010 Calculus of gallbladder with chronic cholecystitis without obstruction: Secondary | ICD-10-CM | POA: Insufficient documentation

## 2014-06-25 DIAGNOSIS — I10 Essential (primary) hypertension: Secondary | ICD-10-CM | POA: Insufficient documentation

## 2014-06-25 HISTORY — PX: CHOLECYSTECTOMY: SHX55

## 2014-06-25 SURGERY — LAPAROSCOPIC CHOLECYSTECTOMY
Anesthesia: General | Site: Abdomen

## 2014-06-25 MED ORDER — CIPROFLOXACIN IN D5W 400 MG/200ML IV SOLN
INTRAVENOUS | Status: AC
  Filled 2014-06-25: qty 200

## 2014-06-25 MED ORDER — CIPROFLOXACIN IN D5W 400 MG/200ML IV SOLN
400.0000 mg | INTRAVENOUS | Status: AC
  Administered 2014-06-25: 400 mg via INTRAVENOUS

## 2014-06-25 MED ORDER — GLYCOPYRROLATE 0.2 MG/ML IJ SOLN
0.2000 mg | Freq: Once | INTRAMUSCULAR | Status: AC
  Administered 2014-06-25: 0.2 mg via INTRAVENOUS

## 2014-06-25 MED ORDER — MIDAZOLAM HCL 2 MG/2ML IJ SOLN
INTRAMUSCULAR | Status: AC
  Filled 2014-06-25: qty 2

## 2014-06-25 MED ORDER — ONDANSETRON HCL 4 MG/2ML IJ SOLN: 4.0000 mg | Freq: Once | INTRAMUSCULAR | Status: DC | PRN

## 2014-06-25 MED ORDER — ONDANSETRON HCL 4 MG/2ML IJ SOLN
4.0000 mg | Freq: Once | INTRAMUSCULAR | Status: AC
  Administered 2014-06-25: 4 mg via INTRAVENOUS

## 2014-06-25 MED ORDER — PROPOFOL 10 MG/ML IV EMUL
INTRAVENOUS | Status: AC
  Filled 2014-06-25: qty 20

## 2014-06-25 MED ORDER — PROPOFOL 10 MG/ML IV BOLUS
INTRAVENOUS | Status: DC | PRN
  Administered 2014-06-25: 150 mg via INTRAVENOUS

## 2014-06-25 MED ORDER — LIDOCAINE HCL 1 % IJ SOLN
INTRAMUSCULAR | Status: DC | PRN
  Administered 2014-06-25: 50 mg via INTRADERMAL

## 2014-06-25 MED ORDER — DEXTROSE 5 % IV SOLN
INTRAVENOUS | Status: DC | PRN
  Administered 2014-06-25: 10:00:00 via INTRAVENOUS

## 2014-06-25 MED ORDER — FENTANYL CITRATE 0.05 MG/ML IJ SOLN
INTRAMUSCULAR | Status: DC | PRN
  Administered 2014-06-25: 100 ug via INTRAVENOUS
  Administered 2014-06-25 (×3): 50 ug via INTRAVENOUS

## 2014-06-25 MED ORDER — ROCURONIUM BROMIDE 100 MG/10ML IV SOLN
INTRAVENOUS | Status: DC | PRN
  Administered 2014-06-25: 35 mg via INTRAVENOUS

## 2014-06-25 MED ORDER — FENTANYL CITRATE 0.05 MG/ML IJ SOLN
INTRAMUSCULAR | Status: AC
Start: 2014-06-25 — End: 2014-06-25
  Filled 2014-06-25: qty 2

## 2014-06-25 MED ORDER — BUPIVACAINE HCL (PF) 0.5 % IJ SOLN
INTRAMUSCULAR | Status: AC
  Filled 2014-06-25: qty 30

## 2014-06-25 MED ORDER — MIDAZOLAM HCL 5 MG/5ML IJ SOLN
INTRAMUSCULAR | Status: DC | PRN
  Administered 2014-06-25: 2 mg via INTRAVENOUS

## 2014-06-25 MED ORDER — CHLORHEXIDINE GLUCONATE 4 % EX LIQD: 1.0000 "application " | Freq: Once | CUTANEOUS | Status: DC

## 2014-06-25 MED ORDER — GLYCOPYRROLATE 0.2 MG/ML IJ SOLN
INTRAMUSCULAR | Status: DC | PRN
  Administered 2014-06-25: 0.6 mg via INTRAVENOUS

## 2014-06-25 MED ORDER — FENTANYL CITRATE 0.05 MG/ML IJ SOLN
INTRAMUSCULAR | Status: AC
  Filled 2014-06-25: qty 5

## 2014-06-25 MED ORDER — MIDAZOLAM HCL 2 MG/2ML IJ SOLN
1.0000 mg | INTRAMUSCULAR | Status: DC | PRN
  Administered 2014-06-25: 2 mg via INTRAVENOUS

## 2014-06-25 MED ORDER — ONDANSETRON HCL 4 MG/2ML IJ SOLN
INTRAMUSCULAR | Status: AC
  Filled 2014-06-25: qty 2

## 2014-06-25 MED ORDER — FENTANYL CITRATE 0.05 MG/ML IJ SOLN
25.0000 ug | INTRAMUSCULAR | Status: DC | PRN
  Administered 2014-06-25: 50 ug via INTRAVENOUS

## 2014-06-25 MED ORDER — POVIDONE-IODINE 10 % EX OINT
TOPICAL_OINTMENT | CUTANEOUS | Status: AC
  Filled 2014-06-25: qty 1

## 2014-06-25 MED ORDER — SODIUM CHLORIDE 0.9 % IR SOLN
Status: DC | PRN
  Administered 2014-06-25: 1000 mL

## 2014-06-25 MED ORDER — POVIDONE-IODINE 10 % OINT PACKET
TOPICAL_OINTMENT | CUTANEOUS | Status: DC | PRN
  Administered 2014-06-25: 1 via TOPICAL

## 2014-06-25 MED ORDER — BUPIVACAINE HCL (PF) 0.5 % IJ SOLN
INTRAMUSCULAR | Status: DC | PRN
  Administered 2014-06-25: 10 mL

## 2014-06-25 MED ORDER — LACTATED RINGERS IV SOLN
INTRAVENOUS | Status: DC
  Administered 2014-06-25 (×2): via INTRAVENOUS

## 2014-06-25 MED ORDER — NEOSTIGMINE METHYLSULFATE 10 MG/10ML IV SOLN
INTRAVENOUS | Status: DC | PRN
  Administered 2014-06-25: 3 mg via INTRAVENOUS

## 2014-06-25 MED ORDER — FENTANYL CITRATE 0.05 MG/ML IJ SOLN
INTRAMUSCULAR | Status: AC
  Filled 2014-06-25: qty 2

## 2014-06-25 MED ORDER — GLYCOPYRROLATE 0.2 MG/ML IJ SOLN
INTRAMUSCULAR | Status: AC
  Filled 2014-06-25: qty 1

## 2014-06-25 MED ORDER — OXYCODONE-ACETAMINOPHEN 7.5-325 MG PO TABS: 1.0000 | ORAL_TABLET | ORAL | Status: DC | PRN

## 2014-06-25 MED ORDER — HEMOSTATIC AGENTS (NO CHARGE) OPTIME
TOPICAL | Status: DC | PRN
  Administered 2014-06-25: 1

## 2014-06-25 MED ORDER — FENTANYL CITRATE 0.05 MG/ML IJ SOLN
25.0000 ug | INTRAMUSCULAR | Status: AC
  Administered 2014-06-25 (×2): 25 ug via INTRAVENOUS

## 2014-06-25 MED ORDER — GLYCOPYRROLATE 0.2 MG/ML IJ SOLN
INTRAMUSCULAR | Status: AC
  Filled 2014-06-25: qty 3

## 2014-06-25 MED ORDER — KETOROLAC TROMETHAMINE 30 MG/ML IJ SOLN
30.0000 mg | Freq: Once | INTRAMUSCULAR | Status: AC
  Administered 2014-06-25: 30 mg via INTRAVENOUS
  Filled 2014-06-25: qty 1

## 2014-06-25 SURGICAL SUPPLY — 41 items
APPLIER CLIP LAPSCP 10X32 DD (CLIP) ×2 IMPLANT
BAG HAMPER (MISCELLANEOUS) ×2 IMPLANT
BAG SPEC RTRVL LRG 6X4 10 (ENDOMECHANICALS) ×1
BLADE 11 SAFETY STRL DISP (BLADE) ×2 IMPLANT
CLOTH BEACON ORANGE TIMEOUT ST (SAFETY) ×2 IMPLANT
COVER LIGHT HANDLE STERIS (MISCELLANEOUS) ×4 IMPLANT
DECANTER SPIKE VIAL GLASS SM (MISCELLANEOUS) ×2 IMPLANT
DURAPREP 26ML APPLICATOR (WOUND CARE) ×2 IMPLANT
ELECT REM PT RETURN 9FT ADLT (ELECTROSURGICAL) ×2
ELECTRODE REM PT RTRN 9FT ADLT (ELECTROSURGICAL) ×1 IMPLANT
EVACUATOR SMOKE 8.L (FILTER) ×1 IMPLANT
FORMALIN 10 PREFIL 120ML (MISCELLANEOUS) ×2 IMPLANT
GLOVE BIOGEL PI IND STRL 7.0 (GLOVE) IMPLANT
GLOVE BIOGEL PI INDICATOR 7.0 (GLOVE) ×2
GLOVE ECLIPSE 6.5 STRL STRAW (GLOVE) ×1 IMPLANT
GLOVE ECLIPSE 7.0 STRL STRAW (GLOVE) ×1 IMPLANT
GLOVE EXAM NITRILE LRG STRL (GLOVE) ×1 IMPLANT
GLOVE SURG SS PI 7.5 STRL IVOR (GLOVE) ×2 IMPLANT
GOWN STRL REUS W/TWL LRG LVL3 (GOWN DISPOSABLE) ×6 IMPLANT
HEMOSTAT SNOW SURGICEL 2X4 (HEMOSTASIS) ×2 IMPLANT
INST SET LAPROSCOPIC AP (KITS) ×2 IMPLANT
KIT ROOM TURNOVER APOR (KITS) ×2 IMPLANT
MANIFOLD NEPTUNE II (INSTRUMENTS) ×2 IMPLANT
NDL INSUFFLATION 14GA 120MM (NEEDLE) ×1 IMPLANT
NEEDLE INSUFFLATION 14GA 120MM (NEEDLE) ×2 IMPLANT
NS IRRIG 1000ML POUR BTL (IV SOLUTION) ×2 IMPLANT
PACK LAP CHOLE LZT030E (CUSTOM PROCEDURE TRAY) ×2 IMPLANT
PAD ARMBOARD 7.5X6 YLW CONV (MISCELLANEOUS) ×2 IMPLANT
POUCH SPECIMEN RETRIEVAL 10MM (ENDOMECHANICALS) ×2 IMPLANT
SET BASIN LINEN APH (SET/KITS/TRAYS/PACK) ×2 IMPLANT
SLEEVE ENDOPATH XCEL 5M (ENDOMECHANICALS) ×2 IMPLANT
SPONGE GAUZE 2X2 8PLY STRL LF (GAUZE/BANDAGES/DRESSINGS) ×8 IMPLANT
STAPLER VISISTAT (STAPLE) ×2 IMPLANT
SUT VICRYL 0 UR6 27IN ABS (SUTURE) ×2 IMPLANT
TAPE CLOTH SURG 4X10 WHT LF (GAUZE/BANDAGES/DRESSINGS) ×1 IMPLANT
TROCAR ENDO BLADELESS 11MM (ENDOMECHANICALS) ×2 IMPLANT
TROCAR XCEL NON-BLD 5MMX100MML (ENDOMECHANICALS) ×2 IMPLANT
TROCAR XCEL UNIV SLVE 11M 100M (ENDOMECHANICALS) ×2 IMPLANT
TUBING INSUFFLATION (TUBING) ×2 IMPLANT
WARMER LAPAROSCOPE (MISCELLANEOUS) ×2 IMPLANT
YANKAUER SUCT 12FT TUBE ARGYLE (SUCTIONS) ×2 IMPLANT

## 2014-06-25 NOTE — Transfer of Care (Signed)
Immediate Anesthesia Transfer of Care Note  Patient: Brandy Washington  Procedure(s) Performed: Procedure(s): LAPAROSCOPIC CHOLECYSTECTOMY (N/A)  Patient Location: PACU  Anesthesia Type:General  Level of Consciousness: awake and patient cooperative  Airway & Oxygen Therapy: Patient Spontanous Breathing and Patient connected to face mask oxygen  Post-op Assessment: Report given to PACU RN, Post -op Vital signs reviewed and stable and Patient moving all extremities  Post vital signs: Reviewed and stable  Complications: No apparent anesthesia complications

## 2014-06-25 NOTE — Anesthesia Postprocedure Evaluation (Signed)
  Anesthesia Post-op Note  Patient: Brandy Washington  Procedure(s) Performed: Procedure(s): LAPAROSCOPIC CHOLECYSTECTOMY (N/A)  Patient Location: PACU  Anesthesia Type:General  Level of Consciousness: awake, alert , oriented and patient cooperative  Airway and Oxygen Therapy: Patient Spontanous Breathing  Post-op Pain: 3 /10, mild  Post-op Assessment: Post-op Vital signs reviewed, Patient's Cardiovascular Status Stable, Respiratory Function Stable, Patent Airway and Pain level controlled  Post-op Vital Signs: Reviewed and stable  Last Vitals:  Filed Vitals:   06/25/14 1030  BP: 121/70  Pulse:   Temp:   Resp: 45    Complications: No apparent anesthesia complications

## 2014-06-25 NOTE — Anesthesia Procedure Notes (Signed)
Procedure Name: Intubation Date/Time: 06/25/2014 10:42 AM Performed by: Despina HiddenIDACAVAGE, Shahzain Kiester J Pre-anesthesia Checklist: Patient being monitored, Suction available, Emergency Drugs available and Patient identified Patient Re-evaluated:Patient Re-evaluated prior to inductionOxygen Delivery Method: Circle system utilized Preoxygenation: Pre-oxygenation with 100% oxygen Intubation Type: IV induction Ventilation: Mask ventilation without difficulty and Oral airway inserted - appropriate to patient size Laryngoscope Size: Mac and 3 Grade View: Grade I Tube type: Oral Tube size: 7.0 mm Number of attempts: 1 Airway Equipment and Method: Stylet Placement Confirmation: ETT inserted through vocal cords under direct vision,  positive ETCO2 and breath sounds checked- equal and bilateral Secured at: 22 cm Tube secured with: Tape Dental Injury: Teeth and Oropharynx as per pre-operative assessment

## 2014-06-25 NOTE — Interval H&P Note (Signed)
History and Physical Interval Note:  06/25/2014 10:07 AM  Brandy SheffieldLisa V Kuznia  has presented today for surgery, with the diagnosis of cholelithiasis  The various methods of treatment have been discussed with the patient and family. After consideration of risks, benefits and other options for treatment, the patient has consented to  Procedure(s): LAPAROSCOPIC CHOLECYSTECTOMY (N/A) as a surgical intervention .  The patient's history has been reviewed, patient examined, no change in status, stable for surgery.  I have reviewed the patient's chart and labs.  Questions were answered to the patient's satisfaction.     Franky MachoJENKINS,Rolando Whitby A

## 2014-06-25 NOTE — Op Note (Signed)
Patient:  Brandy Washington  DOB:  Aug 23, 1966  MRN:  [id number]   Preop Diagnosis:  Cholecystitis, cholelithiasis  Postop Diagnosis:  Same  Procedure:  Laparoscopic cholecystectomy  Surgeon:  Franky MachoMark Edita Weyenberg, M.D.  Anes:  General endotracheal  Indications:  Patient is a 48 year old white female who presents with cholecystitis secondary to cholelithiasis. The risks and benefits of the procedure including bleeding, infection, hepatobiliary injury, the possibility of an open procedure were fully explained to the patient, who gave informed consent.  Procedure note:  The patient was placed the supine position. After induction of general endotracheal anesthesia, the abdomen was prepped and draped using usual sterile technique with DuraPrep. Surgical site confirmation was performed.  A supraumbilical incision was made down to the fascia. A Veress needle was introduced into the abdominal cavity and confirmation of placement was done using the saline drop test. The abdomen was then insufflated to 16 mm mercury pressure. An 11 mm trocar was introduced into the abdominal cavity under direct visualization without difficulty. The patient was placed in reverse Trendelenburg position and additional 11 mm trocar was placed the epigastric region and 5 mm trochars were placed the right upper quadrant and right flank regions. Liver was inspected and noted within normal limits. The gallbladder was retracted in a dynamic fashion in order to expose the triangle of Calot. The cystic duct was first identified. Its juncture to the infundibulum was fully identified. Endoclips were placed proximally and distally on the cystic duct, and the cystic duct was divided. This was likewise done cystic artery. The gallbladder was then freed away from the fossa using Bovie electrocautery. The gallbladder was delivered through the epigastric trocar site using an Endo Catch bag. The gallbladder fossa was inspected no abnormal bleeding or bile  leakage was noted. Surgicel is placed the gallbladder fossa. All fluid and air were then evacuated from the abdominal cavity prior to removal of the trochars.  All wounds were irrigated with normal saline. All wounds were injected with 0.5% Sensorcaine. The supraumbilical fascia was reapproximated using an 0 Vicryl interrupted suture. All skin incisions were closed using staples. Betadine ointment and dry sterile dressings were applied.  All tape and needle counts were correct the end of the procedure. Patient was extubated in the operating room and transferred to PACU in stable condition.  Complications:  None  EBL:  Minimal  Specimen:  Gallbladder

## 2014-06-25 NOTE — Discharge Instructions (Signed)

## 2014-06-25 NOTE — Anesthesia Preprocedure Evaluation (Signed)
Anesthesia Evaluation  Patient identified by MRN, date of birth, ID band Patient awake    Reviewed: Allergy & Precautions, H&P , NPO status , Patient's Chart, lab work & pertinent test results, reviewed documented beta blocker date and time   Airway Mallampati: I TM Distance: >3 FB Neck ROM: Full    Dental  (+) Teeth Intact   Pulmonary neg pulmonary ROS,  breath sounds clear to auscultation        Cardiovascular hypertension, Pt. on medications and Pt. on home beta blockers Rhythm:Regular Rate:Normal     Neuro/Psych    GI/Hepatic negative GI ROS,   Endo/Other    Renal/GU      Musculoskeletal   Abdominal   Peds  Hematology   Anesthesia Other Findings   Reproductive/Obstetrics                           Anesthesia Physical Anesthesia Plan  ASA: II  Anesthesia Plan: General   Post-op Pain Management:    Induction: Intravenous  Airway Management Planned: Oral ETT  Additional Equipment:   Intra-op Plan:   Post-operative Plan: Extubation in OR  Informed Consent: I have reviewed the patients History and Physical, chart, labs and discussed the procedure including the risks, benefits and alternatives for the proposed anesthesia with the patient or authorized representative who has indicated his/her understanding and acceptance.     Plan Discussed with:   Anesthesia Plan Comments:         Anesthesia Quick Evaluation

## 2014-06-26 ENCOUNTER — Encounter (HOSPITAL_COMMUNITY): Payer: Self-pay | Admitting: General Surgery

## 2014-09-24 ENCOUNTER — Other Ambulatory Visit: Payer: Self-pay | Admitting: Sports Medicine

## 2014-09-24 DIAGNOSIS — M545 Low back pain: Secondary | ICD-10-CM

## 2014-09-24 DIAGNOSIS — M79604 Pain in right leg: Secondary | ICD-10-CM

## 2014-10-03 ENCOUNTER — Ambulatory Visit
Admission: RE | Admit: 2014-10-03 | Discharge: 2014-10-03 | Disposition: A | Discharge status: Home / Self Care | Payer: BC Managed Care – PPO | Source: Ambulatory Visit | Attending: Sports Medicine | Admitting: Sports Medicine

## 2014-10-03 DIAGNOSIS — M545 Low back pain, unspecified: Secondary | ICD-10-CM

## 2014-10-03 DIAGNOSIS — M79604 Pain in right leg: Secondary | ICD-10-CM

## 2014-10-22 ENCOUNTER — Encounter (HOSPITAL_COMMUNITY): Payer: Self-pay | Admitting: General Surgery

## 2015-05-16 ENCOUNTER — Encounter: Payer: Self-pay | Admitting: Obstetrics & Gynecology

## 2015-05-16 ENCOUNTER — Other Ambulatory Visit (HOSPITAL_COMMUNITY)
Admission: RE | Admit: 2015-05-16 | Discharge: 2015-05-16 | Disposition: A | Discharge status: Home / Self Care | Payer: BLUE CROSS/BLUE SHIELD | Source: Ambulatory Visit | Attending: Obstetrics & Gynecology | Admitting: Obstetrics & Gynecology

## 2015-05-16 ENCOUNTER — Ambulatory Visit (INDEPENDENT_AMBULATORY_CARE_PROVIDER_SITE_OTHER): Payer: BLUE CROSS/BLUE SHIELD | Admitting: Obstetrics & Gynecology

## 2015-05-16 VITALS — BP 110/70 | HR 76 | Ht 66.2 in | Wt 251.0 lb

## 2015-05-16 DIAGNOSIS — Z01419 Encounter for gynecological examination (general) (routine) without abnormal findings: Secondary | ICD-10-CM | POA: Insufficient documentation

## 2015-05-16 NOTE — Addendum Note (Signed)
Addended by: Criss AlvinePULLIAM, CHRYSTAL G on: 05/16/2015 03:22 PM   Modules accepted: Orders
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
## 2015-05-22 LAB — CYTOLOGY - PAP

## 2015-08-02 ENCOUNTER — Other Ambulatory Visit: Payer: Self-pay | Admitting: Obstetrics & Gynecology

## 2015-08-02 DIAGNOSIS — Z1231 Encounter for screening mammogram for malignant neoplasm of breast: Secondary | ICD-10-CM

## 2015-08-09 ENCOUNTER — Ambulatory Visit (HOSPITAL_COMMUNITY): Payer: BLUE CROSS/BLUE SHIELD

## 2015-08-14 ENCOUNTER — Ambulatory Visit (HOSPITAL_COMMUNITY)
Admission: RE | Admit: 2015-08-14 | Discharge: 2015-08-14 | Disposition: A | Discharge status: Home / Self Care | Payer: BLUE CROSS/BLUE SHIELD | Source: Ambulatory Visit | Attending: Obstetrics & Gynecology | Admitting: Obstetrics & Gynecology

## 2015-08-14 DIAGNOSIS — Z1231 Encounter for screening mammogram for malignant neoplasm of breast: Secondary | ICD-10-CM | POA: Insufficient documentation

## 2015-11-09 ENCOUNTER — Emergency Department (HOSPITAL_COMMUNITY): Payer: BLUE CROSS/BLUE SHIELD

## 2015-11-09 ENCOUNTER — Encounter (HOSPITAL_COMMUNITY): Payer: Self-pay | Admitting: Emergency Medicine

## 2015-11-09 ENCOUNTER — Emergency Department (HOSPITAL_COMMUNITY)
Admission: EM | Admit: 2015-11-09 | Discharge: 2015-11-09 | Disposition: A | Discharge status: Home / Self Care | Payer: BLUE CROSS/BLUE SHIELD | Attending: Emergency Medicine | Admitting: Emergency Medicine

## 2015-11-09 DIAGNOSIS — S80212A Abrasion, left knee, initial encounter: Secondary | ICD-10-CM | POA: Insufficient documentation

## 2015-11-09 DIAGNOSIS — Z87448 Personal history of other diseases of urinary system: Secondary | ICD-10-CM | POA: Diagnosis not present

## 2015-11-09 DIAGNOSIS — M542 Cervicalgia: Secondary | ICD-10-CM

## 2015-11-09 DIAGNOSIS — S3992XA Unspecified injury of lower back, initial encounter: Secondary | ICD-10-CM | POA: Insufficient documentation

## 2015-11-09 DIAGNOSIS — Y998 Other external cause status: Secondary | ICD-10-CM | POA: Diagnosis not present

## 2015-11-09 DIAGNOSIS — Z79899 Other long term (current) drug therapy: Secondary | ICD-10-CM | POA: Diagnosis not present

## 2015-11-09 DIAGNOSIS — I10 Essential (primary) hypertension: Secondary | ICD-10-CM | POA: Diagnosis not present

## 2015-11-09 DIAGNOSIS — S0990XA Unspecified injury of head, initial encounter: Secondary | ICD-10-CM | POA: Insufficient documentation

## 2015-11-09 DIAGNOSIS — Y9389 Activity, other specified: Secondary | ICD-10-CM | POA: Diagnosis not present

## 2015-11-09 DIAGNOSIS — S199XXA Unspecified injury of neck, initial encounter: Secondary | ICD-10-CM | POA: Insufficient documentation

## 2015-11-09 DIAGNOSIS — S8992XA Unspecified injury of left lower leg, initial encounter: Secondary | ICD-10-CM | POA: Diagnosis present

## 2015-11-09 DIAGNOSIS — M25562 Pain in left knee: Secondary | ICD-10-CM

## 2015-11-09 DIAGNOSIS — Y92410 Unspecified street and highway as the place of occurrence of the external cause: Secondary | ICD-10-CM | POA: Diagnosis not present

## 2015-11-09 MED ORDER — OXYCODONE-ACETAMINOPHEN 5-325 MG PO TABS
1.0000 | ORAL_TABLET | Freq: Once | ORAL | Status: AC
  Administered 2015-11-09: 1 via ORAL
  Filled 2015-11-09: qty 1

## 2015-11-09 MED ORDER — IBUPROFEN 400 MG PO TABS: 400.0000 mg | ORAL_TABLET | Freq: Four times a day (QID) | ORAL | Status: DC | PRN

## 2015-11-09 MED ORDER — METHOCARBAMOL 500 MG PO TABS: 500.0000 mg | ORAL_TABLET | Freq: Two times a day (BID) | ORAL | Status: DC

## 2015-11-09 MED ORDER — KETOROLAC TROMETHAMINE 60 MG/2ML IM SOLN
60.0000 mg | Freq: Once | INTRAMUSCULAR | Status: AC
  Administered 2015-11-09: 60 mg via INTRAMUSCULAR
  Filled 2015-11-09: qty 2
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
## 2016-06-01 ENCOUNTER — Other Ambulatory Visit (HOSPITAL_COMMUNITY)
Admission: RE | Admit: 2016-06-01 | Discharge: 2016-06-01 | Disposition: A | Discharge status: Home / Self Care | Payer: BLUE CROSS/BLUE SHIELD | Source: Ambulatory Visit | Attending: Obstetrics & Gynecology | Admitting: Obstetrics & Gynecology

## 2016-06-01 ENCOUNTER — Encounter: Payer: Self-pay | Admitting: Obstetrics & Gynecology

## 2016-06-01 ENCOUNTER — Ambulatory Visit (INDEPENDENT_AMBULATORY_CARE_PROVIDER_SITE_OTHER): Payer: BLUE CROSS/BLUE SHIELD | Admitting: Obstetrics & Gynecology

## 2016-06-01 VITALS — BP 106/60 | HR 72 | Ht 66.0 in | Wt 252.0 lb

## 2016-06-01 DIAGNOSIS — Z01419 Encounter for gynecological examination (general) (routine) without abnormal findings: Secondary | ICD-10-CM

## 2016-06-01 NOTE — Progress Notes (Signed)
Patient ID: Brandy Washington, female   DOB: 02/08/1966, 50 y.o.   MRN: [id number] Subjective:     Brandy Washington is a 50 y.o. female here for a routine exam.  No LMP recorded. Patient has had an ablation. G3P3 Birth Control Method:  BTL Menstrual Calendar(currently): amenorrhea s/p ablation  Current complaints: none.   Current acute medical issues:  none   Recent Gynecologic History No LMP recorded. Patient has had an ablation. Last Pap: 2016,  normal Last mammogram: 07/2015,  normal  Past Medical History  Diagnosis Date  . Broken wrist     right  . Hypertension   . 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 Tubal ligation    . Ablation      ABLATION  . Cholecystectomy N/A 06/25/2014    Procedure: LAPAROSCOPIC CHOLECYSTECTOMY;  Surgeon: Dalia HeadingMark A Jenkins, MD;  Location: [facility];  Service: General;  Laterality: N/A;  . Gallbladder surgery      OB History    Gravida Para Term Preterm AB TAB SAB Ectopic Multiple Living   3 3        2       Social History   Social History  . Marital Status: Married    Spouse Name: N/A  . Number of Children: N/A  . Years of Education: N/A   Social History Main Topics  . Smoking status: Never Smoker   . Smokeless tobacco: Never Used  . Alcohol Use: No  . Drug Use: No  . Sexual Activity: Yes    Birth Control/ Protection: Surgical   Other Topics Concern  . None   Social History Narrative    Family History  Problem Relation Age of Onset  . Stroke Other   . Hypertension Other   . Heart disease Other   . Cancer Other      Current outpatient prescriptions:  .  acetaminophen (TYLENOL) 500 MG tablet, Take 1,000 mg by mouth every 6 (six) hours as needed for moderate pain or headache., Disp: , Rfl:  .  doxycycline (VIBRAMYCIN) 100 MG capsule, Take 100 mg by mouth 2 (two) times daily., Disp: , Rfl:  .  lisinopril-hydrochlorothiazide (PRINZIDE,ZESTORETIC)  10-12.5 MG tablet, Take 1 tablet by mouth daily., Disp: , Rfl: 2 .  metoprolol tartrate (LOPRESSOR) 25 MG tablet, Take 25 mg by mouth daily. , Disp: , Rfl:  .  cyclobenzaprine (FLEXERIL) 10 MG tablet, Take 10 mg by mouth 3 (three) times daily as needed for muscle spasms. Reported on 06/01/2016, Disp: , Rfl:  .  ibuprofen (ADVIL,MOTRIN) 400 MG tablet, Take 1 tablet (400 mg total) by mouth every 6 (six) hours as needed. (Patient not taking: Reported on 06/01/2016), Disp: 30 tablet, Rfl: 0  Review of Systems  Review of Systems  Constitutional: Negative for fever, chills, weight loss, malaise/fatigue and diaphoresis.  HENT: Negative for hearing loss, ear pain, nosebleeds, congestion, sore throat, neck pain, tinnitus and ear discharge.   Eyes: Negative for blurred vision, double vision, photophobia, pain, discharge and redness.  Respiratory: Negative for cough, hemoptysis, sputum production, shortness of breath, wheezing and stridor.   Cardiovascular: Negative for chest pain, palpitations, orthopnea, claudication, leg swelling and PND.  Gastrointestinal: negative for abdominal pain. Negative for heartburn, nausea, vomiting, diarrhea, constipation, blood in stool and melena.  Genitourinary: Negative for dysuria, urgency, frequency, hematuria and flank pain.  Musculoskeletal: Negative for myalgias, back  pain, joint pain and falls.  Skin: Negative for itching and rash.  Neurological: Negative for dizziness, tingling, tremors, sensory change, speech change, focal weakness, seizures, loss of consciousness, weakness and headaches.  Endo/Heme/Allergies: Negative for environmental allergies and polydipsia. Does not bruise/bleed easily.  Psychiatric/Behavioral: Negative for depression, suicidal ideas, hallucinations, memory loss and substance abuse. The patient is not nervous/anxious and does not have insomnia.        Objective:  Blood pressure 106/60, pulse 72, height  (1.676 m), weight 252 lb (114.306  kg).   Physical Exam  Vitals reviewed. Constitutional: She is oriented to person, place, and time. She appears well-developed and well-nourished.  HENT:  Head: Normocephalic and atraumatic.        Right Ear: External ear normal.  Left Ear: External ear normal.  Nose: Nose normal.  Mouth/Throat: Oropharynx is clear and moist.  Eyes: Conjunctivae and EOM are normal. Pupils are equal, round, and reactive to light. Right eye exhibits no discharge. Left eye exhibits no discharge. No scleral icterus.  Neck: Normal range of motion. Neck supple. No tracheal deviation present. No thyromegaly present.  Cardiovascular: Normal rate, regular rhythm, normal heart sounds and intact distal pulses.  Exam reveals no gallop and no friction rub.   No murmur heard. Respiratory: Effort normal and breath sounds normal. No respiratory distress. She has no wheezes. She has no rales. She exhibits no tenderness.  GI: Soft. Bowel sounds are normal. She exhibits no distension and no mass. There is no tenderness. There is no rebound and no guarding.  Genitourinary:  Breasts no masses skin changes or nipple changes bilaterally      Vulva is normal without lesions Vagina is pink moist without discharge Cervix normal in appearance and pap is done Uterus is normal size shape and contour Adnexa is negative with normal sized ovaries   Musculoskeletal: Normal range of motion. She exhibits no edema and no tenderness.  Neurological: She is alert and oriented to person, place, and time. She has normal reflexes. She displays normal reflexes. No cranial nerve deficit. She exhibits normal muscle tone. Coordination normal.  Skin: Skin is warm and dry. No rash noted. No erythema. No pallor.  Psychiatric: She has a normal mood and affect. Her behavior is normal. Judgment and thought content normal.       Medications Ordered at today's visit: Meds ordered this encounter  Medications  . doxycycline (VIBRAMYCIN) 100 MG capsule     Sig: Take 100 mg by mouth 2 (two) times daily.    Other orders placed at today's visit: No orders of the defined types were placed in this encounter.      Assessment:    Healthy female exam.    Plan:    Mammogram ordered. Follow up in: 1 year.     Return in about 1 year (around 06/01/2017) for yearly, with Dr Despina Hidden.

## 2016-06-03 LAB — CYTOLOGY - PAP

## 2016-06-10 ENCOUNTER — Other Ambulatory Visit: Payer: Self-pay | Admitting: Dermatology

## 2016-08-03 ENCOUNTER — Other Ambulatory Visit: Payer: Self-pay | Admitting: Obstetrics & Gynecology

## 2016-08-03 DIAGNOSIS — Z1231 Encounter for screening mammogram for malignant neoplasm of breast: Secondary | ICD-10-CM

## 2016-08-14 ENCOUNTER — Ambulatory Visit (HOSPITAL_COMMUNITY)
Admission: RE | Admit: 2016-08-14 | Discharge: 2016-08-14 | Disposition: A | Discharge status: Home / Self Care | Payer: BLUE CROSS/BLUE SHIELD | Source: Ambulatory Visit | Attending: Obstetrics & Gynecology | Admitting: Obstetrics & Gynecology

## 2016-08-14 DIAGNOSIS — Z1231 Encounter for screening mammogram for malignant neoplasm of breast: Secondary | ICD-10-CM | POA: Diagnosis present

## 2017-06-03 ENCOUNTER — Encounter: Payer: Self-pay | Admitting: Obstetrics & Gynecology

## 2017-06-03 ENCOUNTER — Ambulatory Visit (INDEPENDENT_AMBULATORY_CARE_PROVIDER_SITE_OTHER): Payer: BLUE CROSS/BLUE SHIELD | Admitting: Obstetrics & Gynecology

## 2017-06-03 ENCOUNTER — Other Ambulatory Visit (HOSPITAL_COMMUNITY)
Admission: RE | Admit: 2017-06-03 | Discharge: 2017-06-03 | Disposition: A | Discharge status: Home / Self Care | Payer: BLUE CROSS/BLUE SHIELD | Source: Ambulatory Visit | Attending: Obstetrics & Gynecology | Admitting: Obstetrics & Gynecology

## 2017-06-03 ENCOUNTER — Other Ambulatory Visit: Payer: Self-pay | Admitting: Obstetrics & Gynecology

## 2017-06-03 VITALS — BP 120/78 | HR 80 | Ht 66.0 in | Wt 250.0 lb

## 2017-06-03 DIAGNOSIS — Z01419 Encounter for gynecological examination (general) (routine) without abnormal findings: Secondary | ICD-10-CM

## 2017-06-03 DIAGNOSIS — Z1231 Encounter for screening mammogram for malignant neoplasm of breast: Secondary | ICD-10-CM

## 2017-06-03 NOTE — Progress Notes (Signed)
Subjective:     Brandy Washington is a 51 y.o. female here for a routine exam.  No LMP recorded. Patient has had an ablation. G3P3 Birth Control Method:  Tubal ligation Menstrual Calendar(currently): amenorrheic after ablation  Current complaints: none.   Current acute medical issues:  none   Recent Gynecologic History No LMP recorded. Patient has had an ablation. Last Pap: 2017,  normal Last mammogram: 06/01/2016,  normal  Past Medical History:  Diagnosis Date  . Broken wrist    right  . Hypertension   . 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 ABLATION     ABLATION  . CHOLECYSTECTOMY N/A 06/25/2014   Procedure: LAPAROSCOPIC CHOLECYSTECTOMY;  Surgeon: Dalia Heading, MD;  Location: [facility];  Service: General;  Laterality: N/A;  . GALLBLADDER SURGERY    . TUBAL LIGATION      OB History    Gravida Para Term Preterm AB Living   3 3       2    SAB TAB Ectopic Multiple Live Births           2      Social History   Social History  . Marital status: Married    Spouse name: N/A  . Number of children: N/A  . Years of education: N/A   Social History Main Topics  . Smoking status: Never Smoker  . Smokeless tobacco: Never Used  . Alcohol use No  . Drug use: No  . Sexual activity: Yes    Birth control/ protection: Surgical   Other Topics Concern  . None   Social History Narrative  . None    Family History  Problem Relation Age of Onset  . Stroke Other   . Hypertension Other   . Heart disease Other   . Cancer Other      Current Outpatient Prescriptions:  .  ibuprofen (ADVIL,MOTRIN) 400 MG tablet, Take 1 tablet (400 mg total) by mouth every 6 (six) hours as needed., Disp: 30 tablet, Rfl: 0 .  lisinopril-hydrochlorothiazide (PRINZIDE,ZESTORETIC) 10-12.5 MG tablet, Take 1 tablet by mouth daily., Disp: , Rfl: 2 .  metoprolol tartrate (LOPRESSOR) 25 MG tablet, Take 25 mg by mouth  daily. , Disp: , Rfl:  .  acetaminophen (TYLENOL) 500 MG tablet, Take 1,000 mg by mouth every 6 (six) hours as needed for moderate pain or headache., Disp: , Rfl:  .  cyclobenzaprine (FLEXERIL) 10 MG tablet, Take 10 mg by mouth 3 (three) times daily as needed for muscle spasms. Reported on 06/01/2016, Disp: , Rfl:   Review of Systems  Review of Systems  Constitutional: Negative for fever, chills, weight loss, malaise/fatigue and diaphoresis.  HENT: Negative for hearing loss, ear pain, nosebleeds, congestion, sore throat, neck pain, tinnitus and ear discharge.   Eyes: Negative for blurred vision, double vision, photophobia, pain, discharge and redness.  Respiratory: Negative for cough, hemoptysis, sputum production, shortness of breath, wheezing and stridor.   Cardiovascular: Negative for chest pain, palpitations, orthopnea, claudication, leg swelling and PND.  Gastrointestinal: negative for abdominal pain. Negative for heartburn, nausea, vomiting, diarrhea, constipation, blood in stool and melena.  Genitourinary: Negative for dysuria, urgency, frequency, hematuria and flank pain.  Musculoskeletal: Negative for myalgias, back pain, joint pain and falls.  Skin: Negative for itching and rash.  Neurological: Negative for dizziness, tingling, tremors, sensory change, speech change, focal weakness, seizures, loss of consciousness,  weakness and headaches.  Endo/Heme/Allergies: Negative for environmental allergies and polydipsia. Does not bruise/bleed easily.  Psychiatric/Behavioral: Negative for depression, suicidal ideas, hallucinations, memory loss and substance abuse. The patient is not nervous/anxious and does not have insomnia.        Objective:  Blood pressure 120/78, pulse 80, height 5\' 6"  (1.676 m), weight 250 lb (113.4 kg).   Physical Exam  Vitals reviewed. Constitutional: She is oriented to person, place, and time. She appears well-developed and well-nourished.  HENT:  Head:  Normocephalic and atraumatic.        Right Ear: External ear normal.  Left Ear: External ear normal.  Nose: Nose normal.  Mouth/Throat: Oropharynx is clear and moist.  Eyes: Conjunctivae and EOM are normal. Pupils are equal, round, and reactive to light. Right eye exhibits no discharge. Left eye exhibits no discharge. No scleral icterus.  Neck: Normal range of motion. Neck supple. No tracheal deviation present. No thyromegaly present.  Cardiovascular: Normal rate, regular rhythm, normal heart sounds and intact distal pulses.  Exam reveals no gallop and no friction rub.   No murmur heard. Respiratory: Effort normal and breath sounds normal. No respiratory distress. She has no wheezes. She has no rales. She exhibits no tenderness.  GI: Soft. Bowel sounds are normal. She exhibits no distension and no mass. There is no tenderness. There is no rebound and no guarding.  Genitourinary:  Breasts no masses skin changes or nipple changes bilaterally      Vulva is normal without lesions Vagina is pink moist without discharge Cervix normal in appearance and pap is done Uterus is normal size shape and contour Adnexa is negative with normal sized ovaries   Musculoskeletal: Normal range of motion. She exhibits no edema and no tenderness.  Neurological: She is alert and oriented to person, place, and time. She has normal reflexes. She displays normal reflexes. No cranial nerve deficit. She exhibits normal muscle tone. Coordination normal.  Skin: Skin is warm and dry. No rash noted. No erythema. No pallor.  Psychiatric: She has a normal mood and affect. Her behavior is normal. Judgment and thought content normal.       Medications Ordered at today's visit: No orders of the defined types were placed in this encounter.   Other orders placed at today's visit: No orders of the defined types were placed in this encounter.     Assessment:    Healthy female exam.    Plan:    Contraception: tubal  ligation. Mammogram ordered. Follow up in: 1 year.     Return in about 1 year (around 06/03/2018) for yearly, with Dr Despina HiddenEure.

## 2017-06-08 LAB — CYTOLOGY - PAP
Diagnosis: NEGATIVE
HPV: NOT DETECTED

## 2017-08-10 ENCOUNTER — Encounter: Payer: Self-pay | Admitting: Obstetrics & Gynecology

## 2017-08-10 ENCOUNTER — Ambulatory Visit (INDEPENDENT_AMBULATORY_CARE_PROVIDER_SITE_OTHER): Payer: BLUE CROSS/BLUE SHIELD | Admitting: Obstetrics & Gynecology

## 2017-08-10 ENCOUNTER — Other Ambulatory Visit (INDEPENDENT_AMBULATORY_CARE_PROVIDER_SITE_OTHER): Payer: BLUE CROSS/BLUE SHIELD

## 2017-08-10 VITALS — BP 134/86 | HR 66 | Ht 66.0 in | Wt 251.0 lb

## 2017-08-10 DIAGNOSIS — R102 Pelvic and perineal pain: Secondary | ICD-10-CM | POA: Diagnosis not present

## 2017-08-10 DIAGNOSIS — N950 Postmenopausal bleeding: Secondary | ICD-10-CM

## 2017-08-10 DIAGNOSIS — N938 Other specified abnormal uterine and vaginal bleeding: Secondary | ICD-10-CM | POA: Diagnosis not present

## 2017-08-10 MED ORDER — CIPROFLOXACIN HCL 500 MG PO TABS: 500.0000 mg | ORAL_TABLET | Freq: Two times a day (BID) | ORAL | 0 refills | Status: DC

## 2017-08-10 MED ORDER — METRONIDAZOLE 500 MG PO TABS: 500.0000 mg | ORAL_TABLET | Freq: Two times a day (BID) | ORAL | 0 refills | Status: DC

## 2017-08-10 NOTE — Progress Notes (Signed)
PELVIC US TA/TV: homogeneous anteverted uterus,wnl,thickened endometrium w/a trace of fluid,EEC 7.4 mm,normal ovaries bilat,ovaries appear mobile,no pain during ultrasound,no free fluid

## 2017-08-10 NOTE — Progress Notes (Signed)
Chief Complaint  Patient presents with  . Vaginal Bleeding    Blood pressure 134/86, pulse 66, height _0  (1.676 m), weight 251 lb (113.9 kg).  51 y.o. G3P3 No LMP recorded. Patient has had an ablation. The current method of family planning is tubal ligation.  Outpatient Encounter Prescriptions as of 08/10/2017  Medication Sig Note  . acetaminophen (TYLENOL) 500 MG tablet Take 1,000 mg by mouth every 6 (six) hours as needed for moderate pain or headache.   . ibuprofen (ADVIL,MOTRIN) 400 MG tablet Take 1 tablet (400 mg total) by mouth every 6 (six) hours as needed.   Marland Kitchen lisinopril-hydrochlorothiazide (PRINZIDE,ZESTORETIC) 10-12.5 MG tablet Take 1 tablet by mouth daily.   . metoprolol tartrate (LOPRESSOR) 25 MG tablet Take 25 mg by mouth daily.  11/09/2015: Prescribed as twice daily but patient only wants to take once daily.  . ciprofloxacin (CIPRO) 500 MG tablet Take 1 tablet (500 mg total) by mouth 2 (two) times daily.   . metroNIDAZOLE (FLAGYL) 500 MG tablet Take 1 tablet (500 mg total) by mouth 2 (two) times daily.   . [DISCONTINUED] cyclobenzaprine (FLEXERIL) 10 MG tablet Take 10 mg by mouth 3 (three) times daily as needed for muscle spasms. Reported on 06/01/2016    No facility-administered encounter medications on file as of 08/10/2017.     Subjective Patient comes in having had a little bit of bleeding not much mostly lower abdominal pain especially on the left side She just doesn't feel herself and just feels awful She hasn't had any fever or may be some episodes of sweating No back pain Is been going on now for just a couple of days has not responded to over-the-counter analgesics Nothing really making eating better  Objective Blood pressure 134/86, pulse 66, height _1  (1.676 m), weight 251 lb (113.9 kg).  Abdominal exam reveals tenderness in the left lower quadrant with no rebound but some guarding acute abdomen but pretty impressively tender Pelvic exam reveals  tenderness to cervical motion and uterine tenderness and also to palpation left adnexa which otherwise feels normal  Pertinent ROS No burning with urination, frequency or urgency No nausea, vomiting or diarrhea Nor fever chills or other constitutional symptoms   Labs or studies Pending lab work and ultrasound    Impression Diagnoses this Encounter::   ICD-10-CM   1. Pelvic pain R10.2 US Pelvis Complete    US Transvaginal Non-OB    CBC w/Diff/Platelet    Sed Rate (ESR)  2. DUB (dysfunctional uterine bleeding) N93.8 US Pelvis Complete    US Transvaginal Non-OB    Established relevant diagnosis(es):   Plan/Recommendations: Meds ordered this encounter  Medications  . ciprofloxacin (CIPRO) 500 MG tablet    Sig: Take 1 tablet (500 mg total) by mouth 2 (two) times daily.    Dispense:  20 tablet    Refill:  0  . metroNIDAZOLE (FLAGYL) 500 MG tablet    Sig: Take 1 tablet (500 mg total) by mouth 2 (two) times daily.    Dispense:  20 tablet    Refill:  0    Labs or Scans Ordered: Orders Placed This Encounter  Procedures  . US Pelvis Complete  . US Transvaginal Non-OB  . CBC w/Diff/Platelet  . Sed Rate (ESR)    Management:: This really is most consistent with diverticulitis episode The patient states that her brother has had diverticulitis in the past I don't think this is primarily GYN terminal treat her with Cipro  and Flagyl and see her back in a short interval follow-up and and do a sonogram to rule out any GYN pathology  Follow up Return in about 10 days (around 08/20/2017) for Follow up, with Dr Elonda Husky.        All questions were answered.  Past Medical History:  Diagnosis Date  . Broken wrist    right  . Hypertension   . 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 ABLATION     ABLATION  . CHOLECYSTECTOMY N/A 06/25/2014   Procedure: LAPAROSCOPIC CHOLECYSTECTOMY;  Surgeon:  Jamesetta So, MD;  Location: [facility];  Service: General;  Laterality: N/A;  . GALLBLADDER SURGERY    . TUBAL LIGATION      OB History    Gravida Para Term Preterm AB Living   _0 SAB TAB Ectopic Multiple Live Births           3      No Known Allergies  Social History   Social History  . Marital status: Married    Spouse name: N/A  . Number of children: N/A  . Years of education: N/A   Social History Main Topics  . Smoking status: Never Smoker  . Smokeless tobacco: Never Used  . Alcohol use No  . Drug use: No  . Sexual activity: Yes    Birth control/ protection: Surgical   Other Topics Concern  . None   Social History Narrative  . None    Family History  Problem Relation Age of Onset  . Stroke Other   . Hypertension Other   . Heart disease Other   . Cancer Other   . Heart attack Paternal Grandfather   . Cancer Paternal Grandmother   . Heart attack Maternal Grandmother   . Cancer Maternal Grandfather   . Heart attack Father   . Hypertension Mother   . Atrial fibrillation Mother   . COPD Brother   . Hypertension Sister   . Hypertension Brother   . Multiple sclerosis Brother

## 2017-08-11 ENCOUNTER — Encounter: Payer: Self-pay | Admitting: Obstetrics & Gynecology

## 2017-08-11 LAB — CBC WITH DIFFERENTIAL/PLATELET
Basophils Absolute: 0 10*3/uL (ref 0.0–0.2)
Basos: 1 %
EOS (ABSOLUTE): 0.2 10*3/uL (ref 0.0–0.4)
Eos: 2 %
Hematocrit: 36.9 % (ref 34.0–46.6)
Hemoglobin: 12.5 g/dL (ref 11.1–15.9)
Immature Grans (Abs): 0 10*3/uL (ref 0.0–0.1)
Immature Granulocytes: 0 %
Lymphocytes Absolute: 2.6 10*3/uL (ref 0.7–3.1)
Lymphs: 31 %
MCH: 26.9 pg (ref 26.6–33.0)
MCHC: 33.9 g/dL (ref 31.5–35.7)
MCV: 80 fL (ref 79–97)
Monocytes Absolute: 0.6 10*3/uL (ref 0.1–0.9)
Monocytes: 7 %
Neutrophils Absolute: 5 10*3/uL (ref 1.4–7.0)
Neutrophils: 59 %
Platelets: 319 10*3/uL (ref 150–379)
RBC: 4.64 x10E6/uL (ref 3.77–5.28)
RDW: 14.5 % (ref 12.3–15.4)
WBC: 8.4 10*3/uL (ref 3.4–10.8)

## 2017-08-11 LAB — SEDIMENTATION RATE: Sed Rate: 4 mm/hr (ref 0–40)

## 2017-08-16 ENCOUNTER — Other Ambulatory Visit: Payer: BLUE CROSS/BLUE SHIELD

## 2017-08-16 ENCOUNTER — Ambulatory Visit (HOSPITAL_COMMUNITY)
Admission: RE | Admit: 2017-08-16 | Discharge: 2017-08-16 | Disposition: A | Discharge status: Home / Self Care | Payer: BLUE CROSS/BLUE SHIELD | Source: Ambulatory Visit | Attending: Obstetrics & Gynecology | Admitting: Obstetrics & Gynecology

## 2017-08-16 DIAGNOSIS — Z1231 Encounter for screening mammogram for malignant neoplasm of breast: Secondary | ICD-10-CM | POA: Insufficient documentation

## 2017-08-20 ENCOUNTER — Ambulatory Visit (INDEPENDENT_AMBULATORY_CARE_PROVIDER_SITE_OTHER): Payer: BLUE CROSS/BLUE SHIELD | Admitting: Obstetrics & Gynecology

## 2017-08-20 ENCOUNTER — Encounter: Payer: Self-pay | Admitting: Obstetrics & Gynecology

## 2017-08-20 VITALS — BP 118/72 | HR 72 | Ht 66.0 in | Wt 249.0 lb

## 2017-08-20 DIAGNOSIS — K5792 Diverticulitis of intestine, part unspecified, without perforation or abscess without bleeding: Secondary | ICD-10-CM | POA: Diagnosis not present

## 2017-08-20 MED ORDER — ZOLPIDEM TARTRATE 10 MG PO TABS
10.0000 mg | ORAL_TABLET | Freq: Every evening | ORAL | 3 refills | Status: DC | PRN
Start: 2017-08-20 — End: 2018-09-02

## 2017-08-20 NOTE — Progress Notes (Signed)
Chief Complaint  Patient presents with  . Follow-up    Blood pressure 118/72, pulse 72, height 5\' 6"  (1.676 m), weight 249 lb (112.9 kg).  51 y.o. G3P3 No LMP recorded. Patient has had an ablation. The current method of family planning is ablation.  Outpatient Encounter Medications as of 08/20/2017  Medication Sig Note  . acetaminophen (TYLENOL) 500 MG tablet Take 1,000 mg by mouth every 6 (six) hours as needed for moderate pain or headache.   . ciprofloxacin (CIPRO) 500 MG tablet Take 1 tablet (500 mg total) by mouth 2 (two) times daily.   Marland Kitchen ibuprofen (ADVIL,MOTRIN) 400 MG tablet Take 1 tablet (400 mg total) by mouth every 6 (six) hours as needed.   Marland Kitchen lisinopril-hydrochlorothiazide (PRINZIDE,ZESTORETIC) 10-12.5 MG tablet Take 1 tablet by mouth daily.   . metoprolol tartrate (LOPRESSOR) 25 MG tablet Take 25 mg by mouth daily.  11/09/2015: Prescribed as twice daily but patient only wants to take once daily.  . metroNIDAZOLE (FLAGYL) 500 MG tablet Take 1 tablet (500 mg total) by mouth 2 (two) times daily.   Marland Kitchen zolpidem (AMBIEN) 10 MG tablet Take 1 tablet (10 mg total) by mouth at bedtime as needed for sleep.    No facility-administered encounter medications on file as of 08/20/2017.     Subjective Brandy Washington seen in follow-up after having a significant amount of abdominal pain I treated her for diverticulitis and she is much much better Her symptoms of essentially resolved And she reiterates 1 more that her brother has diverticulitis  Objective   Pertinent ROS   Labs or studies     Impression Diagnoses this Encounter::   ICD-10-CM   1. Diverticulitis K57.92    resolved     Established relevant diagnosis(es):   Plan/Recommendations: Meds ordered this encounter  Medications  . zolpidem (AMBIEN) 10 MG tablet    Sig: Take 1 tablet (10 mg total) by mouth at bedtime as needed for sleep.    Dispense:  30 tablet    Refill:  3    Labs or Scans Ordered: No orders of  the defined types were placed in this encounter.   Management:: No follow-up needed but if she has similar pain again will probably have to get a CT scan as well and evaluated further for now she is comfortable just leaving it at this point since she responded so well  Follow up Return if symptoms worsen or fail to improve.      All questions were answered.  Past Medical History:  Diagnosis Date  . Broken wrist    right  . Hypertension   . 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 ABLATION     ABLATION  . CHOLECYSTECTOMY N/A 06/25/2014   Procedure: LAPAROSCOPIC CHOLECYSTECTOMY;  Surgeon: Dalia Heading, MD;  Location: [facility];  Service: General;  Laterality: N/A;  . GALLBLADDER SURGERY    . TUBAL LIGATION      OB History    Gravida Para Term Preterm AB Living   3 3       3    SAB TAB Ectopic Multiple Live Births           3      No Known Allergies  Social History   Socioeconomic History  . Marital status: Married    Spouse name: None  . Number of children: None  .  Years of education: None  . Highest education level: None  Social Needs  . Financial resource strain: None  . Food insecurity - worry: None  . Food insecurity - inability: None  . Transportation needs - medical: None  . Transportation needs - non-medical: None  Occupational History  . None  Tobacco Use  . Smoking status: Never Smoker  . Smokeless tobacco: Never Used  Substance and Sexual Activity  . Alcohol use: No  . Drug use: No  . Sexual activity: Yes    Birth control/protection: Surgical  Other Topics Concern  . None  Social History Narrative  . None    Family History  Problem Relation Age of Onset  . Stroke Other   . Hypertension Other   . Heart disease Other   . Cancer Other   . Heart attack Paternal Grandfather   . Cancer Paternal Grandmother   . Heart attack Maternal Grandmother   . Cancer  Maternal Grandfather   . Heart attack Father   . Hypertension Mother   . Atrial fibrillation Mother   . COPD Brother   . Hypertension Sister   . Hypertension Brother   . Multiple sclerosis Brother

## 2017-09-07 ENCOUNTER — Telehealth: Payer: Self-pay | Admitting: *Deleted

## 2017-09-07 ENCOUNTER — Encounter: Payer: Self-pay | Admitting: Obstetrics & Gynecology

## 2017-09-07 NOTE — Telephone Encounter (Signed)
Brandy Washington called to let you know mammogram has been read and it should be in the system. JSY

## 2017-11-18 ENCOUNTER — Encounter: Payer: Self-pay | Admitting: Obstetrics & Gynecology

## 2018-04-07 ENCOUNTER — Encounter (INDEPENDENT_AMBULATORY_CARE_PROVIDER_SITE_OTHER): Payer: Self-pay | Admitting: Orthopaedic Surgery

## 2018-04-07 ENCOUNTER — Ambulatory Visit (INDEPENDENT_AMBULATORY_CARE_PROVIDER_SITE_OTHER): Payer: BLUE CROSS/BLUE SHIELD | Admitting: Orthopaedic Surgery

## 2018-04-07 VITALS — BP 116/71 | HR 67 | Ht 66.0 in | Wt 243.0 lb

## 2018-04-07 DIAGNOSIS — M1712 Unilateral primary osteoarthritis, left knee: Secondary | ICD-10-CM

## 2018-04-07 MED ORDER — METHYLPREDNISOLONE ACETATE 40 MG/ML IJ SUSP
40.0000 mg | INTRAMUSCULAR | Status: AC | PRN
  Administered 2018-04-07: 40 mg via INTRA_ARTICULAR

## 2018-04-07 MED ORDER — BUPIVACAINE HCL 0.5 % IJ SOLN
3.0000 mL | INTRAMUSCULAR | Status: AC | PRN
  Administered 2018-04-07: 3 mL via INTRA_ARTICULAR

## 2018-04-07 MED ORDER — LIDOCAINE HCL 1 % IJ SOLN
0.5000 mL | INTRAMUSCULAR | Status: AC | PRN
  Administered 2018-04-07: .5 mL

## 2018-04-07 NOTE — Progress Notes (Signed)
Office Visit Note   Patient: Brandy SheffieldLisa V Washington           Date of Birth: [birth year]           MRN: [id number] Visit Date: 04/07/2018              Requested by: Brandy Washington, Kevin, MD [address] [place], [place] [id number] PCP: Brandy Washington, Kevin, MD   Assessment & Plan: Visit Diagnoses:  1. Unilateral primary osteoarthritis, left knee     Plan: Left knee well.  If she has persistent problems she will call and let us know.  Follow-Up Instructions: Return if symptoms worsen or fail to improve.   Orders:  Orders Placed This Encounter  Procedures  . Large Joint Inj   No orders of the defined types were placed in this encounter.     Procedures: Large Joint Inj: L knee on 04/07/2018 10:46 AM Indications: joint swelling and pain Details: 22 G 1.5 in needle, anterolateral approach  Arthrogram: No  Medications: 0.5 mL lidocaine 1 %; 3 mL bupivacaine 0.5 %; 40 mg methylPREDNISolone acetate 40 MG/ML Outcome: tolerated well, no immediate complications Procedure, treatment alternatives, risks and benefits explained, specific risks discussed. Consent was given by the patient. Immediately prior to procedure a time out was called to verify the correct patient, procedure, equipment, support staff and site/side marked as required. Patient was prepped and draped in the usual sterile fashion.       Clinical Data: No additional findings.   Subjective: Chief Complaint  Patient presents with  . Left Knee - Pain    HPI seen with left knee pain.  She and her knee on dashboard about 3 years ago since that time she is noted intermittent symptoms with activity she has pain when she squats problems with stairs.  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  Ice and heat she denies locking.  No other joint symptoms no history of gout or other rheumatologic conditions.  Swelling and has tenderness along the medial joint line.  Review of Systems point  review of system positive for previous right wrist fracture, hypertension GI.Marland Kitchen.  Previous cholecystectomy.  Gallbladder surgery tubal ligation.  Otherwise negative as it pertains HPI.   Objective: Vital Signs: BP 116/71   Pulse 67   Ht 5\' 6"  (1.676 m)   Wt 243 lb (110.2 kg)   BMI 39.22 kg/m   Physical Exam  Constitutional: She is oriented to person, place, and time. She appears well-developed.  HENT:  Head: Normocephalic.  Right Ear: External ear normal.  Left Ear: External ear normal.  Eyes: Pupils are equal, round, and reactive to light.  Neck: No tracheal deviation present. No thyromegaly present.  Cardiovascular: Normal rate.  Pulmonary/Chest: Effort normal.  Abdominal: Soft.  Neurological: She is alert and oriented to person, place, and time.  Skin: Skin is warm and dry.  Psychiatric: She has a normal mood and affect. Her behavior is normal.    Ortho Exam of logroll to the hips negative straight leg raising no palpable Baker's cyst pes bursa is nontender positive patellofemoral grind test.  CL PCL exam is normal.  Collateral ligament exam is normal distal pulses are intact anterior tib gastrocsoleus is strong right and left.  Specialty Comments:  No specialty comments available.  Imaging: X-rays last month greater than 50% medial joint line narrowing patellofemoral degenerative spurs   PMFS History: Patient Active Problem List  Diagnosis Date Noted  . Hypertension 05/10/2014   Past Medical History:  Diagnosis Date  . Broken wrist    right  . Hypertension   . 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 Stroke Other   . Hypertension Other   . Heart disease Other   . Cancer Other   . Heart attack Paternal Grandfather   . Cancer Paternal Grandmother   . Heart attack Maternal Grandmother   . Cancer Maternal Grandfather   . Heart attack Father   . Hypertension Mother   . Atrial  fibrillation Mother   . COPD Brother   . Hypertension Sister   . Hypertension Brother   . Multiple sclerosis Brother     Past Surgical History:  Procedure Laterality Date  . ABLATION     ABLATION  . CHOLECYSTECTOMY N/A 06/25/2014   Procedure: LAPAROSCOPIC CHOLECYSTECTOMY;  Surgeon: Dalia Heading, MD;  Location: [facility];  Service: General;  Laterality: N/A;  . GALLBLADDER SURGERY    . TUBAL LIGATION     Social History   Occupational History  . Not on file  Tobacco Use  . Smoking status: Never Smoker  . Smokeless tobacco: Never Used  Substance and Sexual Activity  . Alcohol use: No  . Drug use: No  . Sexual activity: Yes    Birth control/protection: Surgical

## 2018-07-05 ENCOUNTER — Other Ambulatory Visit: Payer: Self-pay | Admitting: Obstetrics & Gynecology

## 2018-07-05 DIAGNOSIS — Z1231 Encounter for screening mammogram for malignant neoplasm of breast: Secondary | ICD-10-CM

## 2018-09-02 ENCOUNTER — Encounter: Payer: Self-pay | Admitting: Obstetrics & Gynecology

## 2018-09-02 ENCOUNTER — Ambulatory Visit (INDEPENDENT_AMBULATORY_CARE_PROVIDER_SITE_OTHER): Payer: BLUE CROSS/BLUE SHIELD | Admitting: Obstetrics & Gynecology

## 2018-09-02 ENCOUNTER — Other Ambulatory Visit (HOSPITAL_COMMUNITY)
Admission: RE | Admit: 2018-09-02 | Discharge: 2018-09-02 | Disposition: A | Discharge status: Home / Self Care | Payer: BLUE CROSS/BLUE SHIELD | Source: Ambulatory Visit | Attending: Obstetrics & Gynecology | Admitting: Obstetrics & Gynecology

## 2018-09-02 ENCOUNTER — Ambulatory Visit (HOSPITAL_COMMUNITY)
Admission: RE | Admit: 2018-09-02 | Discharge: 2018-09-02 | Disposition: A | Discharge status: Home / Self Care | Payer: BLUE CROSS/BLUE SHIELD | Source: Ambulatory Visit | Attending: Obstetrics & Gynecology | Admitting: Obstetrics & Gynecology

## 2018-09-02 VITALS — BP 123/84 | HR 88 | Ht 65.0 in | Wt 252.0 lb

## 2018-09-02 DIAGNOSIS — Z9851 Tubal ligation status: Secondary | ICD-10-CM

## 2018-09-02 DIAGNOSIS — Z1151 Encounter for screening for human papillomavirus (HPV): Secondary | ICD-10-CM

## 2018-09-02 DIAGNOSIS — Z1211 Encounter for screening for malignant neoplasm of colon: Secondary | ICD-10-CM

## 2018-09-02 DIAGNOSIS — Z1212 Encounter for screening for malignant neoplasm of rectum: Secondary | ICD-10-CM

## 2018-09-02 DIAGNOSIS — Z1231 Encounter for screening mammogram for malignant neoplasm of breast: Secondary | ICD-10-CM | POA: Diagnosis not present

## 2018-09-02 DIAGNOSIS — Z01419 Encounter for gynecological examination (general) (routine) without abnormal findings: Secondary | ICD-10-CM

## 2018-09-02 LAB — HEMOCCULT GUIAC POC 1CARD (OFFICE): Fecal Occult Blood, POC: NEGATIVE

## 2018-09-02 MED ORDER — LANSOPRAZOLE 30 MG PO CPDR: 30.0000 mg | DELAYED_RELEASE_CAPSULE | Freq: Every day | ORAL | 3 refills | Status: AC

## 2018-09-02 NOTE — Progress Notes (Signed)
Subjective:     Brandy Washington is a 52 y.o. female here for a routine exam.  No LMP recorded. Patient has had an ablation. G3P3 Birth Control Method:  BTL Menstrual Calendar(currently): almost amenorrheic  Current complaints: GERD symptoms.   Current acute medical issues:  none   Recent Gynecologic History No LMP recorded. Patient has had an ablation. Last Pap: 2018,  normal Last mammogram: today,  pending  Past Medical History:  Diagnosis Date  . Broken wrist    right  . Hypertension   . 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 ABLATION     ABLATION  . CHOLECYSTECTOMY N/A 06/25/2014   Procedure: LAPAROSCOPIC CHOLECYSTECTOMY;  Surgeon: Dalia Heading, MD;  Location: [facility];  Service: General;  Laterality: N/A;  . GALLBLADDER SURGERY    . TUBAL LIGATION      OB History    Gravida  3   Para  3   Term      Preterm      AB      Living  3     SAB      TAB      Ectopic      Multiple      Live Births  3           Social History   Socioeconomic History  . Marital status: Married    Spouse name: Not on file  . Number of children: Not on file  . Years of education: Not on file  . Highest education level: Not on file  Occupational History  . Not on file  Social Needs  . Financial resource strain: Not on file  . Food insecurity:    Worry: Not on file    Inability: Not on file  . Transportation needs:    Medical: Not on file    Non-medical: Not on file  Tobacco Use  . Smoking status: Never Smoker  . Smokeless tobacco: Never Used  Substance and Sexual Activity  . Alcohol use: No  . Drug use: No  . Sexual activity: Yes    Birth control/protection: Surgical    Comment: tubal and ablation  Lifestyle  . Physical activity:    Days per week: Not on file    Minutes per session: Not on file  . Stress: Not on file  Relationships  . Social connections:    Talks on  phone: Not on file    Gets together: Not on file    Attends religious service: Not on file    Active member of club or organization: Not on file    Attends meetings of clubs or organizations: Not on file    Relationship status: Not on file  Other Topics Concern  . Not on file  Social History Narrative  . Not on file    Family History  Problem Relation Age of Onset  . Stroke Other   . Hypertension Other   . Heart disease Other   . Cancer Other   . Heart attack Paternal Grandfather   . Cancer Paternal Grandmother   . Heart attack Maternal Grandmother   . Cancer Maternal Grandfather   . Heart attack Father   . Hypertension Mother   . Atrial fibrillation Mother   . COPD Brother   . Hypertension Sister   . Hypertension Brother   . Multiple sclerosis Brother  Current Outpatient Medications:  .  acetaminophen (TYLENOL) 500 MG tablet, Take 1,000 mg by mouth every 6 (six) hours as needed for moderate pain or headache., Disp: , Rfl:  .  lisinopril-hydrochlorothiazide (PRINZIDE,ZESTORETIC) 10-12.5 MG tablet, Take 1 tablet by mouth daily., Disp: , Rfl: 2 .  lansoprazole (PREVACID) 30 MG capsule, Take 1 capsule (30 mg total) by mouth daily at 12 noon., Disp: 30 capsule, Rfl: 3  Review of Systems  Review of Systems  Constitutional: Negative for fever, chills, weight loss, malaise/fatigue and diaphoresis.  HENT: Negative for hearing loss, ear pain, nosebleeds, congestion, sore throat, neck pain, tinnitus and ear discharge.   Eyes: Negative for blurred vision, double vision, photophobia, pain, discharge and redness.  Respiratory: Negative for cough, hemoptysis, sputum production, shortness of breath, wheezing and stridor.   Cardiovascular: Negative for chest pain, palpitations, orthopnea, claudication, leg swelling and PND.  Gastrointestinal: negative for abdominal pain. Negative for heartburn, nausea, vomiting, diarrhea, constipation, blood in stool and melena.  Genitourinary:  Negative for dysuria, urgency, frequency, hematuria and flank pain.  Musculoskeletal: Negative for myalgias, back pain, joint pain and falls.  Skin: Negative for itching and rash.  Neurological: Negative for dizziness, tingling, tremors, sensory change, speech change, focal weakness, seizures, loss of consciousness, weakness and headaches.  Endo/Heme/Allergies: Negative for environmental allergies and polydipsia. Does not bruise/bleed easily.  Psychiatric/Behavioral: Negative for depression, suicidal ideas, hallucinations, memory loss and substance abuse. The patient is not nervous/anxious and does not have insomnia.        Objective:  Blood pressure 123/84, pulse 88, height 5\' 5"  (1.651 m), weight 252 lb (114.3 kg).   Physical Exam  Vitals reviewed. Constitutional: She is oriented to person, place, and time. She appears well-developed and well-nourished.  HENT:  Head: Normocephalic and atraumatic.        Right Ear: External ear normal.  Left Ear: External ear normal.  Nose: Nose normal.  Mouth/Throat: Oropharynx is clear and moist.  Eyes: Conjunctivae and EOM are normal. Pupils are equal, round, and reactive to light. Right eye exhibits no discharge. Left eye exhibits no discharge. No scleral icterus.  Neck: Normal range of motion. Neck supple. No tracheal deviation present. No thyromegaly present.  Cardiovascular: Normal rate, regular rhythm, normal heart sounds and intact distal pulses.  Exam reveals no gallop and no friction rub.   No murmur heard. Respiratory: Effort normal and breath sounds normal. No respiratory distress. She has no wheezes. She has no rales. She exhibits no tenderness.  GI: Soft. Bowel sounds are normal. She exhibits no distension and no mass. There is no tenderness. There is no rebound and no guarding.  Genitourinary:  Breasts no masses skin changes or nipple changes bilaterally      Vulva is normal without lesions Vagina is pink moist without discharge Cervix  normal in appearance and pap is done Uterus is normal size shape and contour Adnexa is negative with normal sized ovaries  {Rectal    hemoccult negative, normal tone, no masses  Musculoskeletal: Normal range of motion. She exhibits no edema and no tenderness.  Neurological: She is alert and oriented to person, place, and time. She has normal reflexes. She displays normal reflexes. No cranial nerve deficit. She exhibits normal muscle tone. Coordination normal.  Skin: Skin is warm and dry. No rash noted. No erythema. No pallor.  Psychiatric: She has a normal mood and affect. Her behavior is normal. Judgment and thought content normal.       Medications Ordered at today's  visit: Meds ordered this encounter  Medications  . lansoprazole (PREVACID) 30 MG capsule    Sig: Take 1 capsule (30 mg total) by mouth daily at 12 noon.    Dispense:  30 capsule    Refill:  3    Other orders placed at today's visit: No orders of the defined types were placed in this encounter.     Assessment:    Healthy female exam.   GERD Plan:    Contraception: tubal ligation.   S/p ablation Begin prevacid for GERD symptoms  Return in about 1 year (around 09/03/2019).

## 2018-09-02 NOTE — Addendum Note (Signed)
Addended by: Colen DarlingYOUNG, Varun Jourdan S on: 09/02/2018 12:35 PM   Modules accepted: Orders

## 2018-09-05 ENCOUNTER — Other Ambulatory Visit: Payer: Self-pay | Admitting: Obstetrics & Gynecology

## 2018-09-05 DIAGNOSIS — R921 Mammographic calcification found on diagnostic imaging of breast: Secondary | ICD-10-CM

## 2018-09-07 LAB — CYTOLOGY - PAP
Chlamydia: NEGATIVE
Diagnosis: NEGATIVE
HPV: NOT DETECTED
Neisseria Gonorrhea: NEGATIVE

## 2018-09-13 ENCOUNTER — Ambulatory Visit (HOSPITAL_COMMUNITY)
Admission: RE | Admit: 2018-09-13 | Discharge: 2018-09-13 | Disposition: A | Discharge status: Home / Self Care | Payer: BLUE CROSS/BLUE SHIELD | Source: Ambulatory Visit | Attending: Obstetrics & Gynecology | Admitting: Obstetrics & Gynecology

## 2018-09-13 DIAGNOSIS — R921 Mammographic calcification found on diagnostic imaging of breast: Secondary | ICD-10-CM | POA: Diagnosis present

## 2019-07-24 ENCOUNTER — Other Ambulatory Visit: Payer: Self-pay | Admitting: Sports Medicine

## 2019-07-24 DIAGNOSIS — M5416 Radiculopathy, lumbar region: Secondary | ICD-10-CM

## 2019-08-16 ENCOUNTER — Other Ambulatory Visit: Payer: Self-pay

## 2019-08-16 ENCOUNTER — Ambulatory Visit
Admission: RE | Admit: 2019-08-16 | Discharge: 2019-08-16 | Disposition: A | Discharge status: Home / Self Care | Payer: BC Managed Care – PPO | Source: Ambulatory Visit | Attending: Sports Medicine | Admitting: Sports Medicine

## 2019-08-16 DIAGNOSIS — M5416 Radiculopathy, lumbar region: Secondary | ICD-10-CM

## 2019-08-19 ENCOUNTER — Other Ambulatory Visit: Payer: BLUE CROSS/BLUE SHIELD

## 2020-04-08 ENCOUNTER — Other Ambulatory Visit (HOSPITAL_COMMUNITY): Payer: Self-pay | Admitting: Obstetrics & Gynecology

## 2020-04-08 DIAGNOSIS — Z1231 Encounter for screening mammogram for malignant neoplasm of breast: Secondary | ICD-10-CM

## 2020-04-18 ENCOUNTER — Other Ambulatory Visit (HOSPITAL_COMMUNITY)
Admission: RE | Admit: 2020-04-18 | Discharge: 2020-04-18 | Disposition: A | Discharge status: Home / Self Care | Payer: BC Managed Care – PPO | Source: Ambulatory Visit | Attending: Obstetrics & Gynecology | Admitting: Obstetrics & Gynecology

## 2020-04-18 ENCOUNTER — Ambulatory Visit (HOSPITAL_COMMUNITY)
Admission: RE | Admit: 2020-04-18 | Discharge: 2020-04-18 | Disposition: A | Discharge status: Home / Self Care | Payer: BC Managed Care – PPO | Source: Ambulatory Visit | Attending: Obstetrics & Gynecology | Admitting: Obstetrics & Gynecology

## 2020-04-18 ENCOUNTER — Other Ambulatory Visit: Payer: Self-pay

## 2020-04-18 ENCOUNTER — Encounter: Payer: Self-pay | Admitting: Obstetrics & Gynecology

## 2020-04-18 ENCOUNTER — Ambulatory Visit (INDEPENDENT_AMBULATORY_CARE_PROVIDER_SITE_OTHER): Payer: BC Managed Care – PPO | Admitting: Obstetrics & Gynecology

## 2020-04-18 VITALS — BP 123/77 | HR 88 | Ht 65.0 in | Wt 265.0 lb

## 2020-04-18 DIAGNOSIS — Z1212 Encounter for screening for malignant neoplasm of rectum: Secondary | ICD-10-CM

## 2020-04-18 DIAGNOSIS — Z1211 Encounter for screening for malignant neoplasm of colon: Secondary | ICD-10-CM | POA: Diagnosis not present

## 2020-04-18 DIAGNOSIS — Z01419 Encounter for gynecological examination (general) (routine) without abnormal findings: Secondary | ICD-10-CM

## 2020-04-18 DIAGNOSIS — Z1151 Encounter for screening for human papillomavirus (HPV): Secondary | ICD-10-CM | POA: Insufficient documentation

## 2020-04-18 DIAGNOSIS — Z1231 Encounter for screening mammogram for malignant neoplasm of breast: Secondary | ICD-10-CM | POA: Diagnosis not present

## 2020-04-18 LAB — HEMOCCULT GUIAC POC 1CARD (OFFICE): Fecal Occult Blood, POC: NEGATIVE

## 2020-04-18 NOTE — Progress Notes (Signed)
Subjective:     Brandy Washington is a 54 y.o. female here for a routine exam.  No LMP recorded. Patient has had an ablation. G3P3 Birth Control Method:  BTL/ablation Menstrual Calendar(currently): amenorrheic  Current complaints: none.   Current acute medical issues:  none   Recent Gynecologic History No LMP recorded. Patient has had an ablation. Last Pap: 2019,  normal Last mammogram: today,  normal  Past Medical History:  Diagnosis Date  . Broken wrist    right  . Hypertension   . 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 ABLATION     ABLATION  . CHOLECYSTECTOMY N/A 06/25/2014   Procedure: LAPAROSCOPIC CHOLECYSTECTOMY;  Surgeon: Dalia Heading, MD;  Location: [facility];  Service: General;  Laterality: N/A;  . GALLBLADDER SURGERY    . TUBAL LIGATION      OB History    Gravida  3   Para  3   Term      Preterm      AB      Living  3     SAB      TAB      Ectopic      Multiple      Live Births  3           Social History   Socioeconomic History  . Marital status: Married    Spouse name: Not on file  . Number of children: Not on file  . Years of education: Not on file  . Highest education level: Not on file  Occupational History  . Not on file  Tobacco Use  . Smoking status: Never Smoker  . Smokeless tobacco: Never Used  Substance and Sexual Activity  . Alcohol use: No  . Drug use: No  . Sexual activity: Yes    Birth control/protection: Surgical    Comment: tubal and ablation  Other Topics Concern  . Not on file  Social History Narrative  . Not on file   Social Determinants of Health   Financial Resource Strain: Low Risk   . Difficulty of Paying Living Expenses: Not hard at all  Food Insecurity: No Food Insecurity  . Worried About Programme researcher, broadcasting/film/video in the Last Year: Never true  . Ran Out of Food in the Last Year: Never true  Transportation Needs: No  Transportation Needs  . Lack of Transportation (Medical): No  . Lack of Transportation (Non-Medical): No  Physical Activity: Inactive  . Days of Exercise per Week: 0 days  . Minutes of Exercise per Session: 0 min  Stress: No Stress Concern Present  . Feeling of Stress : Not at all  Social Connections: Not Isolated  . Frequency of Communication with Friends and Family: More than three times a week  . Frequency of Social Gatherings with Friends and Family: Three times a week  . Attends Religious Services: More than 4 times per year  . Active Member of Clubs or Organizations: Yes  . Attends Banker Meetings: More than 4 times per year  . Marital Status: Married    Family History  Problem Relation Age of Onset  . Stroke Other   . Hypertension Other   . Heart disease Other   . Cancer Other   . Heart attack Paternal Grandfather   . Cancer Paternal Grandmother   . Heart attack Maternal Grandmother   .  Cancer Maternal Grandfather   . Heart attack Father   . Hypertension Mother   . Atrial fibrillation Mother   . COPD Brother   . Hypertension Sister   . Hypertension Brother   . Multiple sclerosis Brother      Current Outpatient Medications:  .  acetaminophen (TYLENOL) 500 MG tablet, Take 1,000 mg by mouth every 6 (six) hours as needed for moderate pain or headache., Disp: , Rfl:  .  diclofenac (CATAFLAM) 50 MG tablet, Take 50 mg by mouth daily. , Disp: , Rfl:  .  lansoprazole (PREVACID) 30 MG capsule, Take 1 capsule (30 mg total) by mouth daily at 12 noon. (Patient taking differently: Take 30 mg by mouth. ), Disp: 30 capsule, Rfl: 3 .  lisinopril-hydrochlorothiazide (PRINZIDE,ZESTORETIC) 10-12.5 MG tablet, Take 1 tablet by mouth daily., Disp: , Rfl: 2 .  venlafaxine XR (EFFEXOR-XR) 37.5 MG 24 hr capsule, Take 37.5 mg by mouth daily with breakfast. , Disp: , Rfl:   Review of Systems  Review of Systems  Constitutional: Negative for fever, chills, weight loss,  malaise/fatigue and diaphoresis.  HENT: Negative for hearing loss, ear pain, nosebleeds, congestion, sore throat, neck pain, tinnitus and ear discharge.   Eyes: Negative for blurred vision, double vision, photophobia, pain, discharge and redness.  Respiratory: Negative for cough, hemoptysis, sputum production, shortness of breath, wheezing and stridor.   Cardiovascular: Negative for chest pain, palpitations, orthopnea, claudication, leg swelling and PND.  Gastrointestinal: negative for abdominal pain. Negative for heartburn, nausea, vomiting, diarrhea, constipation, blood in stool and melena.  Genitourinary: Negative for dysuria, urgency, frequency, hematuria and flank pain.  Musculoskeletal: Negative for myalgias, back pain, joint pain and falls.  Skin: Negative for itching and rash.  Neurological: Negative for dizziness, tingling, tremors, sensory change, speech change, focal weakness, seizures, loss of consciousness, weakness and headaches.  Endo/Heme/Allergies: Negative for environmental allergies and polydipsia. Does not bruise/bleed easily.  Psychiatric/Behavioral: Negative for depression, suicidal ideas, hallucinations, memory loss and substance abuse. The patient is not nervous/anxious and does not have insomnia.        Objective:  Blood pressure 123/77, pulse 88, height 5\' 5"  (1.651 m), weight 265 lb (120.2 kg).   Physical Exam  Vitals reviewed. Constitutional: She is oriented to person, place, and time. She appears well-developed and well-nourished.  HENT:  Head: Normocephalic and atraumatic.        Right Ear: External ear normal.  Left Ear: External ear normal.  Nose: Nose normal.  Mouth/Throat: Oropharynx is clear and moist.  Eyes: Conjunctivae and EOM are normal. Pupils are equal, round, and reactive to light. Right eye exhibits no discharge. Left eye exhibits no discharge. No scleral icterus.  Neck: Normal range of motion. Neck supple. No tracheal deviation present. No  thyromegaly present.  Cardiovascular: Normal rate, regular rhythm, normal heart sounds and intact distal pulses.  Exam reveals no gallop and no friction rub.   No murmur heard. Respiratory: Effort normal and breath sounds normal. No respiratory distress. She has no wheezes. She has no rales. She exhibits no tenderness.  GI: Soft. Bowel sounds are normal. She exhibits no distension and no mass. There is no tenderness. There is no rebound and no guarding.  Genitourinary:  Breasts no masses skin changes or nipple changes bilaterally      Vulva is normal without lesions Vagina is pink moist without discharge Cervix normal in appearance and pap is done Uterus is normal size shape and contour Adnexa is negative with normal sized ovaries  {  Rectal    hemoccult negative, normal tone, no masses  Musculoskeletal: Normal range of motion. She exhibits no edema and no tenderness.  Neurological: She is alert and oriented to person, place, and time. She has normal reflexes. She displays normal reflexes. No cranial nerve deficit. She exhibits normal muscle tone. Coordination normal.  Skin: Skin is warm and dry. No rash noted. No erythema. No pallor.  Psychiatric: She has a normal mood and affect. Her behavior is normal. Judgment and thought content normal.       Medications Ordered at today's visit: No orders of the defined types were placed in this encounter.   Other orders placed at today's visit: No orders of the defined types were placed in this encounter.     Assessment:    Normal Gyn exam.    Plan:    Contraception: tubal ligation. Mammogram ordered. Follow up in: 2 years.     No follow-ups on file.

## 2020-04-18 NOTE — Addendum Note (Signed)
Addended by: Colen Darling on: 04/18/2020 03:12 PM   Modules accepted: Orders

## 2020-04-22 LAB — CYTOLOGY - PAP
Comment: NEGATIVE
Diagnosis: NEGATIVE
High risk HPV: NEGATIVE

## 2020-08-28 ENCOUNTER — Ambulatory Visit: Payer: BC Managed Care – PPO | Admitting: Cardiology

## 2020-08-28 NOTE — Progress Notes (Deleted)
Clinical Summary Ms. Skaggs is a 54 y.o.female seen as new consult, referred by Dr Dimas Aguas for chest pain.   1. Chest pain   - started on omeporazole by pcp  Past Medical History:  Diagnosis Date  . Broken wrist    right  . Hypertension   . 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 Sulfa Antibiotics      Current Outpatient Medications  Medication Sig Dispense Refill  . acetaminophen (TYLENOL) 500 MG tablet Take 1,000 mg by mouth every 6 (six) hours as needed for moderate pain or headache.    . diclofenac (CATAFLAM) 50 MG tablet Take 50 mg by mouth daily.     . lansoprazole (PREVACID) 30 MG capsule Take 1 capsule (30 mg total) by mouth daily at 12 noon. (Patient taking differently: Take 30 mg by mouth. ) 30 capsule 3  . lisinopril-hydrochlorothiazide (PRINZIDE,ZESTORETIC) 10-12.5 MG tablet Take 1 tablet by mouth daily.  2  . venlafaxine XR (EFFEXOR-XR) 37.5 MG 24 hr capsule Take 37.5 mg by mouth daily with breakfast.      No current facility-administered medications for this visit.     Past Surgical History:  Procedure Laterality Date  . ABLATION     ABLATION  . CHOLECYSTECTOMY N/A 06/25/2014   Procedure: LAPAROSCOPIC CHOLECYSTECTOMY;  Surgeon: Dalia Heading, MD;  Location: [facility];  Service: General;  Laterality: N/A;  . GALLBLADDER SURGERY    . TUBAL LIGATION       Allergies  Allergen Reactions  . Sulfa Antibiotics       Family History  Problem Relation Age of Onset  . Stroke Other   . Hypertension Other   . Heart disease Other   . Cancer Other   . Heart attack Paternal Grandfather   . Cancer Paternal Grandmother   . Heart attack Maternal Grandmother   . Cancer Maternal Grandfather   . Heart attack Father   . Hypertension Mother   . Atrial fibrillation Mother   . COPD Brother   . Hypertension Sister   . Hypertension Brother   . Multiple sclerosis Brother      Social  History Ms. Fahmy reports that she has never smoked. She has never used smokeless tobacco. Ms. Krasner reports no history of alcohol use.   Review of Systems CONSTITUTIONAL: No weight loss, fever, chills, weakness or fatigue.  HEENT: Eyes: No visual loss, blurred vision, double vision or yellow sclerae.No hearing loss, sneezing, congestion, runny nose or sore throat.  SKIN: No rash or itching.  CARDIOVASCULAR:  RESPIRATORY: No shortness of breath, cough or sputum.  GASTROINTESTINAL: No anorexia, nausea, vomiting or diarrhea. No abdominal pain or blood.  GENITOURINARY: No burning on urination, no polyuria NEUROLOGICAL: No headache, dizziness, syncope, paralysis, ataxia, numbness or tingling in the extremities. No change in bowel or bladder control.  MUSCULOSKELETAL: No muscle, back pain, joint pain or stiffness.  LYMPHATICS: No enlarged nodes. No history of splenectomy.  PSYCHIATRIC: No history of depression or anxiety.  ENDOCRINOLOGIC: No reports of sweating, cold or heat intolerance. No polyuria or polydipsia.  Marland Kitchen   Physical Examination There were no vitals filed for this visit. There were no vitals filed for this visit.  Gen: resting comfortably, no acute distress HEENT: no scleral icterus, pupils equal round and reactive, no palptable cervical adenopathy,  CV Resp: Clear to auscultation bilaterally GI: abdomen is soft, non-tender, non-distended, normal  bowel sounds, no hepatosplenomegaly MSK: extremities are warm, no edema.  Skin: warm, no rash Neuro:  no focal deficits Psych: appropriate affect   Diagnostic Studies     Assessment and Plan        Antoine Poche, M.D., F.A.C.C.

## 2020-10-01 ENCOUNTER — Ambulatory Visit (INDEPENDENT_AMBULATORY_CARE_PROVIDER_SITE_OTHER): Payer: BC Managed Care – PPO | Admitting: Cardiology

## 2020-10-01 ENCOUNTER — Telehealth: Payer: Self-pay | Admitting: Cardiology

## 2020-10-01 ENCOUNTER — Encounter: Payer: Self-pay | Admitting: *Deleted

## 2020-10-01 ENCOUNTER — Encounter: Payer: Self-pay | Admitting: Cardiology

## 2020-10-01 VITALS — BP 126/80 | HR 70 | Ht 66.0 in | Wt 246.0 lb

## 2020-10-01 DIAGNOSIS — R079 Chest pain, unspecified: Secondary | ICD-10-CM

## 2020-10-01 DIAGNOSIS — I10 Essential (primary) hypertension: Secondary | ICD-10-CM
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
## 2020-10-01 NOTE — Patient Instructions (Signed)
Your physician recommends that you schedule a follow-up appointment PENDING WITH DR [facility]  Your physician recommends that you continue on your current medications as directed. Please refer to the Current Medication list given to you today.  Your physician has requested that you have a lexiscan myoview. For further information please visit [email or web address] Please follow instruction sheet, as given.  Thank you for choosing [facility]!!

## 2020-10-01 NOTE — Telephone Encounter (Signed)
Pre-cert Verification for the following procedure    LEXISCAN   DATE:   10/08/2020  LOCATION: [facility]

## 2020-10-08 ENCOUNTER — Encounter (HOSPITAL_COMMUNITY): Payer: BC Managed Care – PPO

## 2020-10-28 ENCOUNTER — Ambulatory Visit: Payer: BC Managed Care – PPO | Admitting: Cardiology

## 2021-10-09 ENCOUNTER — Ambulatory Visit: Payer: Self-pay

## 2021-10-09 ENCOUNTER — Ambulatory Visit (INDEPENDENT_AMBULATORY_CARE_PROVIDER_SITE_OTHER): Payer: BC Managed Care – PPO | Admitting: Physician Assistant

## 2021-10-09 ENCOUNTER — Other Ambulatory Visit: Payer: Self-pay

## 2021-10-09 ENCOUNTER — Encounter: Payer: Self-pay | Admitting: Physician Assistant

## 2021-10-09 DIAGNOSIS — M1712 Unilateral primary osteoarthritis, left knee: Secondary | ICD-10-CM | POA: Diagnosis not present

## 2021-10-09 DIAGNOSIS — M25562 Pain in left knee: Secondary | ICD-10-CM | POA: Diagnosis not present

## 2021-10-09 MED ORDER — LIDOCAINE HCL 1 % IJ SOLN
4.0000 mL | INTRAMUSCULAR | Status: AC | PRN
  Administered 2021-10-09: 4 mL

## 2021-10-09 MED ORDER — METHYLPREDNISOLONE ACETATE 40 MG/ML IJ SUSP
40.0000 mg | INTRAMUSCULAR | Status: AC | PRN
  Administered 2021-10-09: 40 mg via INTRA_ARTICULAR
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
## 2023-08-11 ENCOUNTER — Other Ambulatory Visit: Payer: Self-pay | Admitting: Family Medicine

## 2023-08-11 DIAGNOSIS — M25551 Pain in right hip: Secondary | ICD-10-CM

## 2023-09-12 ENCOUNTER — Ambulatory Visit
Admission: RE | Admit: 2023-09-12 | Discharge: 2023-09-12 | Disposition: A | Discharge status: Home / Self Care | Payer: BC Managed Care – PPO | Source: Ambulatory Visit | Attending: Family Medicine | Admitting: Family Medicine

## 2023-09-12 DIAGNOSIS — M25551 Pain in right hip: Secondary | ICD-10-CM
